# Patient Record
Sex: Female | Born: 1975 | Race: White | Hispanic: No | Marital: Married | Smoking: Never smoker
Health system: Southern US, Community
[De-identification: ages and names within clinical notes are randomized; demographics above are authoritative.]

## PROBLEM LIST (undated history)

## (undated) DIAGNOSIS — K219 Gastro-esophageal reflux disease without esophagitis: Secondary | ICD-10-CM

## (undated) DIAGNOSIS — T8859XA Other complications of anesthesia, initial encounter: Secondary | ICD-10-CM

## (undated) DIAGNOSIS — T85192A Other mechanical complication of implanted electronic neurostimulator (electrode) of spinal cord, initial encounter: Secondary | ICD-10-CM

## (undated) DIAGNOSIS — M5416 Radiculopathy, lumbar region: Secondary | ICD-10-CM

## (undated) DIAGNOSIS — Z9889 Other specified postprocedural states: Secondary | ICD-10-CM

## (undated) DIAGNOSIS — M5126 Other intervertebral disc displacement, lumbar region: Secondary | ICD-10-CM

## (undated) DIAGNOSIS — R112 Nausea with vomiting, unspecified: Secondary | ICD-10-CM

## (undated) DIAGNOSIS — G43909 Migraine, unspecified, not intractable, without status migrainosus: Secondary | ICD-10-CM

## (undated) HISTORY — PX: OTHER SURGICAL HISTORY: SHX169

## (undated) HISTORY — PX: ABDOMINAL HYSTERECTOMY: SHX81

## (undated) HISTORY — PX: IMPLANTATION / PLACEMENT EPIDURAL NEUROSTIMULATOR ELECTRODES: SUR687

---

## 2004-11-29 HISTORY — PX: CHOLECYSTECTOMY: SHX55

## 2005-07-08 ENCOUNTER — Emergency Department: Payer: Self-pay | Admitting: Unknown Physician Specialty

## 2005-08-27 ENCOUNTER — Ambulatory Visit: Payer: Self-pay | Admitting: Internal Medicine

## 2005-11-11 ENCOUNTER — Ambulatory Visit: Payer: Self-pay | Admitting: Obstetrics & Gynecology

## 2007-02-07 ENCOUNTER — Ambulatory Visit: Payer: Self-pay | Admitting: Internal Medicine

## 2007-03-28 ENCOUNTER — Ambulatory Visit: Payer: Self-pay | Admitting: Obstetrics & Gynecology

## 2007-04-06 ENCOUNTER — Ambulatory Visit: Payer: Self-pay | Admitting: Obstetrics & Gynecology

## 2007-09-17 ENCOUNTER — Emergency Department: Payer: Self-pay | Admitting: Emergency Medicine

## 2007-10-21 ENCOUNTER — Emergency Department: Payer: Self-pay | Admitting: Emergency Medicine

## 2007-10-21 ENCOUNTER — Other Ambulatory Visit: Payer: Self-pay

## 2007-10-27 ENCOUNTER — Ambulatory Visit: Payer: Self-pay | Admitting: Internal Medicine

## 2007-12-29 ENCOUNTER — Ambulatory Visit: Payer: Self-pay | Admitting: General Surgery

## 2008-01-09 IMAGING — NM NUCLEAR MEDICINE HEPATOHBILIARY INCLUDE GB
3 series · 21 of 21 positions shown · non-contrast
Comparison: none

REASON FOR EXAM: Abdominal Bloating Neg US October 21, 2007
COMMENTS:

[Series 1000: gallbladder statics · 4.80mm/px · 9 of 9 slices shown]
[im 1/9]
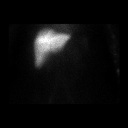
[im 2/9]
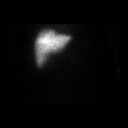
[im 3/9]
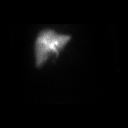
[im 4/9]
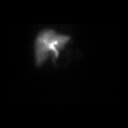
[im 5/9]
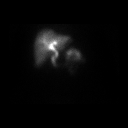
[im 6/9]
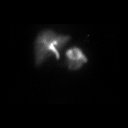
[im 7/9]
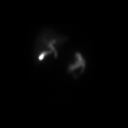
[im 8/9]
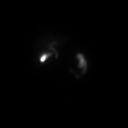
[im 9/9]
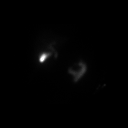

[Series 1000: gallbladder dynamic (results) · 4.80mm/px · 6 of 60 frames shown]
[frame 6/60]
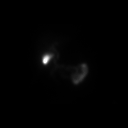
[frame 16/60]
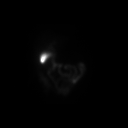
[frame 26/60]
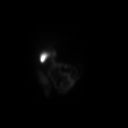
[frame 36/60]
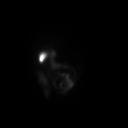
[frame 46/60]
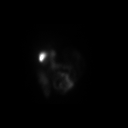
[frame 56/60]
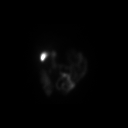

[Series 1000: gallbladder dynamic · 4.80mm/px · 6 of 60 frames shown]
[frame 6/60]
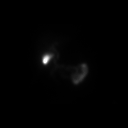
[frame 16/60]
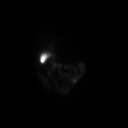
[frame 26/60]
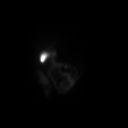
[frame 36/60]
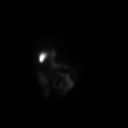
[frame 46/60]
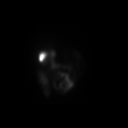
[frame 56/60]
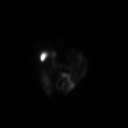

[21 of 21 positions shown; findings below may reference images not displayed]

PROCEDURE:     NM  - NM HEPATO WITH GB EJECT FRACTION  - October 27, 2007 [DATE]

RESULT:     Following injection of 8.06 mCi of technetium-55m Choletec,
there is noted prompt visualization of tracer activity in the liver at 3
minutes. At 40 minutes tracer activity is visualized in the gallbladder,
common duct and proximal small bowel.

The gallbladder ejection fraction at 30 minutes measures 39%, which is in
the lower range of normal.
IMPRESSION: 1. Normal hepatobiliary scan.
2. The gallbladder ejection fraction measures 39%.

## 2008-02-22 ENCOUNTER — Ambulatory Visit: Payer: Self-pay | Admitting: Internal Medicine

## 2008-05-06 IMAGING — US ULTRASOUND LEFT BREAST
1 series · 12 of 12 positions shown · non-contrast
Comparison: none

REASON FOR EXAM: Left breast mass 1 to 2 cm posterior to nipple
COMMENTS:

[Series 1: ultrasound left breast · 12 of 12 slices shown]
[im 1/12]
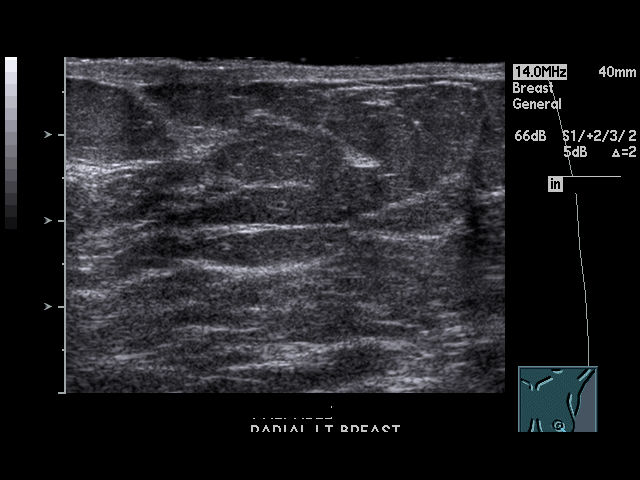
[im 2/12]
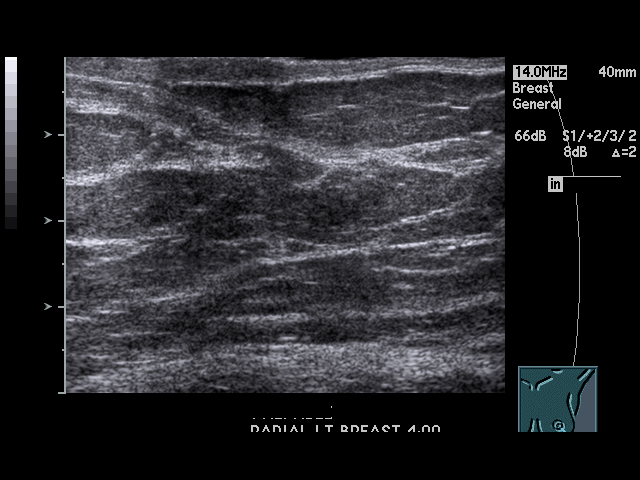
[im 3/12]
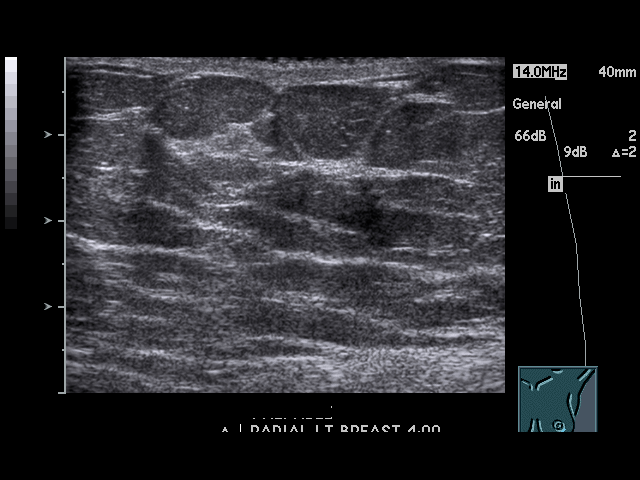
[im 4/12]
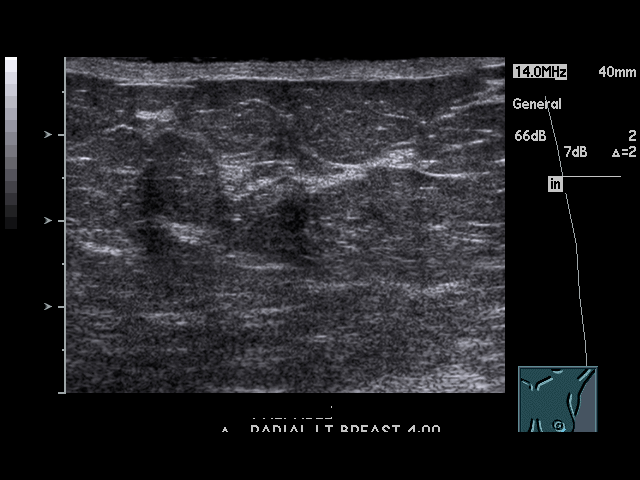
[im 5/12]
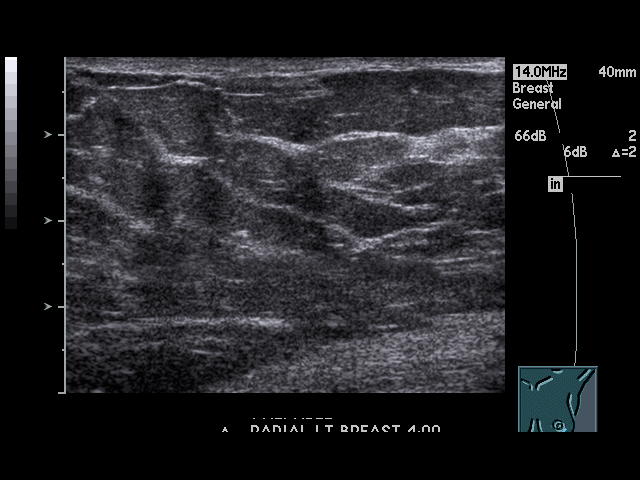
[im 6/12]
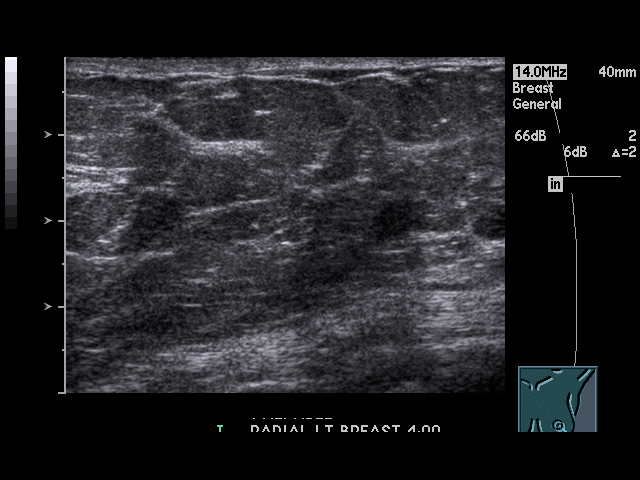
[im 7/12]
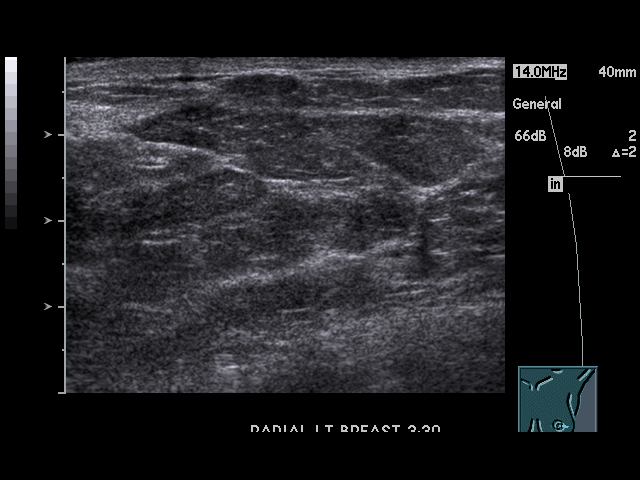
[im 8/12]
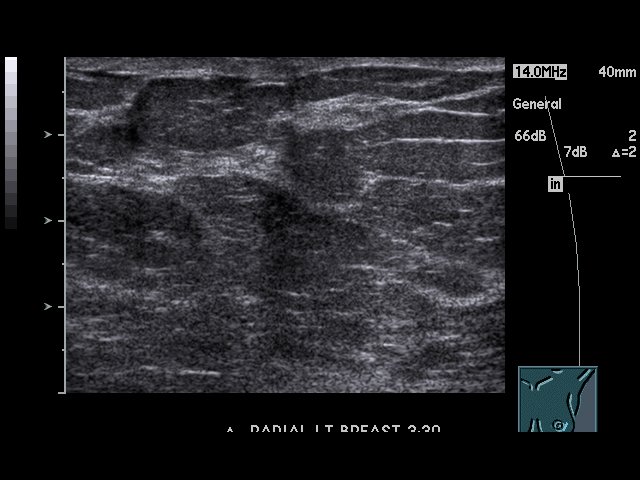
[im 9/12]
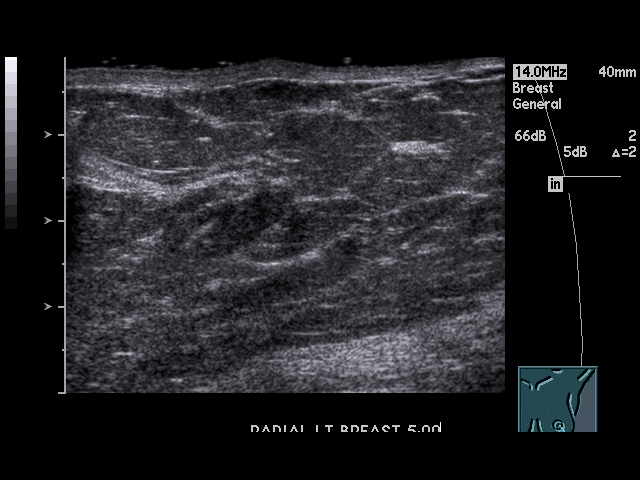
[im 10/12]
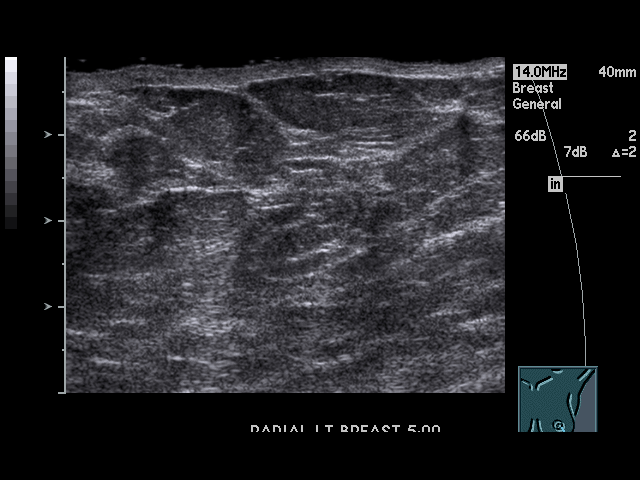
[im 11/12]
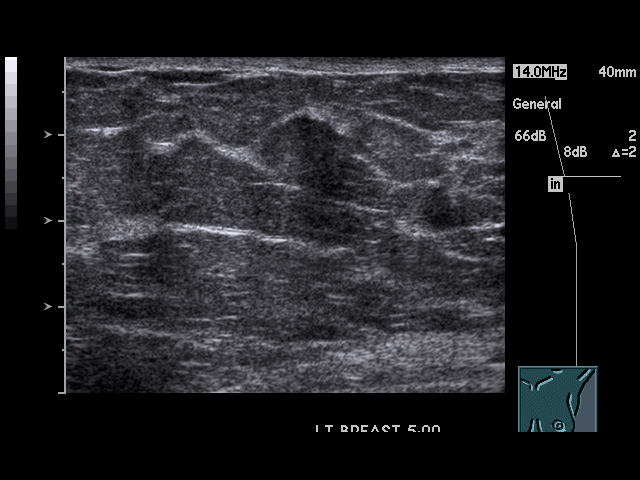
[im 12/12]
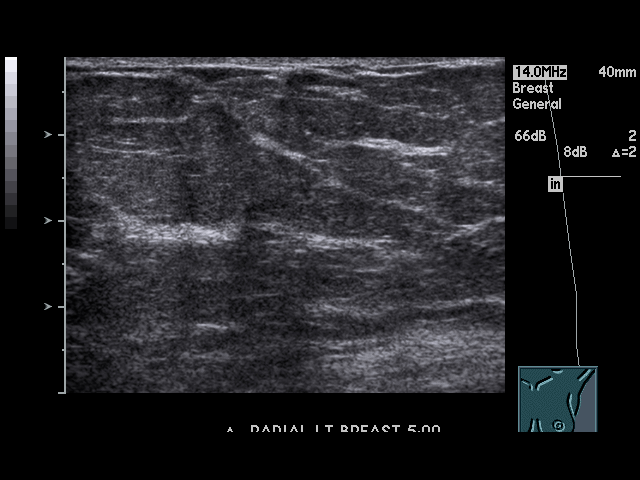

[12 of 12 positions shown; findings below may reference images not displayed]

PROCEDURE:     US  - US BREAST LEFT  - February 22, 2008  [DATE]

RESULT:     The patient has a palpable area of concern in the lower lateral
aspect of the LEFT breast at approximately 4 o'clock.

Ultrasound targeted to this region shows no sonographic abnormalities. No
mass or malignant appearing calcifications are seen. There is no skin
thickening. There is no distortion of the breast architecture.
IMPRESSION: 1.     No significant sonographic abnormalities are identified.
2.     BI-RADS: Category 1  Negative.

## 2008-08-30 ENCOUNTER — Ambulatory Visit: Payer: Self-pay | Admitting: Unknown Physician Specialty

## 2008-11-25 ENCOUNTER — Ambulatory Visit: Payer: Self-pay | Admitting: Internal Medicine

## 2010-06-02 DIAGNOSIS — G43909 Migraine, unspecified, not intractable, without status migrainosus: Secondary | ICD-10-CM | POA: Insufficient documentation

## 2010-06-02 DIAGNOSIS — D519 Vitamin B12 deficiency anemia, unspecified: Secondary | ICD-10-CM | POA: Insufficient documentation

## 2010-06-02 DIAGNOSIS — G4733 Obstructive sleep apnea (adult) (pediatric): Secondary | ICD-10-CM | POA: Insufficient documentation

## 2010-06-02 DIAGNOSIS — J309 Allergic rhinitis, unspecified: Secondary | ICD-10-CM | POA: Insufficient documentation

## 2010-06-02 DIAGNOSIS — F411 Generalized anxiety disorder: Secondary | ICD-10-CM | POA: Insufficient documentation

## 2012-11-29 HISTORY — PX: LAPAROSCOPIC GASTRIC SLEEVE RESECTION: SHX5895

## 2013-10-03 DIAGNOSIS — Z9884 Bariatric surgery status: Secondary | ICD-10-CM | POA: Insufficient documentation

## 2014-10-21 DIAGNOSIS — F32A Depression, unspecified: Secondary | ICD-10-CM | POA: Insufficient documentation

## 2016-04-14 DIAGNOSIS — G4700 Insomnia, unspecified: Secondary | ICD-10-CM | POA: Insufficient documentation

## 2017-01-04 DIAGNOSIS — R5382 Chronic fatigue, unspecified: Secondary | ICD-10-CM | POA: Insufficient documentation

## 2017-11-30 DIAGNOSIS — C9110 Chronic lymphocytic leukemia of B-cell type not having achieved remission: Secondary | ICD-10-CM

## 2017-11-30 DIAGNOSIS — F39 Unspecified mood [affective] disorder: Secondary | ICD-10-CM

## 2017-11-30 DIAGNOSIS — M1990 Unspecified osteoarthritis, unspecified site: Secondary | ICD-10-CM

## 2017-11-30 DIAGNOSIS — G309 Alzheimer's disease, unspecified: Secondary | ICD-10-CM

## 2017-11-30 DIAGNOSIS — E039 Hypothyroidism, unspecified: Secondary | ICD-10-CM

## 2017-12-24 DIAGNOSIS — M722 Plantar fascial fibromatosis: Secondary | ICD-10-CM | POA: Insufficient documentation

## 2018-08-26 DIAGNOSIS — G8929 Other chronic pain: Secondary | ICD-10-CM | POA: Insufficient documentation

## 2018-08-26 DIAGNOSIS — N879 Dysplasia of cervix uteri, unspecified: Secondary | ICD-10-CM | POA: Insufficient documentation

## 2018-08-26 DIAGNOSIS — K219 Gastro-esophageal reflux disease without esophagitis: Secondary | ICD-10-CM | POA: Insufficient documentation

## 2018-11-04 DIAGNOSIS — S93492A Sprain of other ligament of left ankle, initial encounter: Secondary | ICD-10-CM | POA: Insufficient documentation

## 2019-07-01 DIAGNOSIS — M5416 Radiculopathy, lumbar region: Secondary | ICD-10-CM | POA: Insufficient documentation

## 2019-07-13 DIAGNOSIS — M5126 Other intervertebral disc displacement, lumbar region: Secondary | ICD-10-CM | POA: Insufficient documentation

## 2020-04-10 DIAGNOSIS — M7541 Impingement syndrome of right shoulder: Secondary | ICD-10-CM | POA: Insufficient documentation

## 2021-11-11 ENCOUNTER — Ambulatory Visit: Payer: Self-pay | Admitting: Dietician

## 2021-12-07 ENCOUNTER — Encounter: Payer: Self-pay | Admitting: Dietician

## 2021-12-07 NOTE — Progress Notes (Signed)
Have not heard back from patient to reschedule her cancelled appointment from 11/11/21. Sent notification to referring provider.

## 2022-03-17 ENCOUNTER — Ambulatory Visit: Payer: BC Managed Care – PPO | Admitting: Podiatry

## 2022-03-17 DIAGNOSIS — M722 Plantar fascial fibromatosis: Secondary | ICD-10-CM

## 2022-03-30 ENCOUNTER — Ambulatory Visit: Payer: BC Managed Care – PPO | Admitting: Podiatry

## 2022-03-30 ENCOUNTER — Ambulatory Visit: Payer: BC Managed Care – PPO

## 2022-03-30 DIAGNOSIS — M722 Plantar fascial fibromatosis: Secondary | ICD-10-CM | POA: Diagnosis not present

## 2022-03-30 MED ORDER — BETAMETHASONE SOD PHOS & ACET 6 (3-3) MG/ML IJ SUSP
3.0000 mg | Freq: Once | INTRAMUSCULAR | Status: AC
  Administered 2022-03-30: 3 mg via INTRA_ARTICULAR

## 2022-03-30 MED ORDER — METHYLPREDNISOLONE 4 MG PO TBPK: ORAL_TABLET | ORAL | 0 refills | Status: DC

## 2022-03-30 NOTE — Progress Notes (Signed)
? ?  Subjective: ?46 y.o. female presenting as a new patient for evaluation of right heel and leg pain.  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  She is experiencing numbness tingling and burning extending down her right lower extremity.  Patient also states that over the last few weeks she has had increased focal pain to her right plantar heel.  She would like to have it evaluated today.  She presents for further treatment and evaluation ? ? ?No past medical history on file. ? ?Allergies  ?Allergen Reactions  ? Nitrofurantoin Other (See Comments)  ?  Other reaction(s): Unknown ?Other Reaction: Not Assessed ?  ? Oxycodone Itching  ? ? ?Objective: ?Physical Exam ?General: The patient is alert and oriented x3 in no acute distress. ? ?Dermatology: Skin is warm, dry and supple bilateral lower extremities. Negative for open lesions or macerations bilateral.  ? ?Vascular: Dorsalis Pedis and Posterior Tibial pulses palpable bilateral.  Capillary fill time is immediate to all digits. ? ?Neurological: Epicritic and protective threshold intact bilateral.  ? ?Musculoskeletal: Tenderness to palpation to the plantar aspect of the right heel along the plantar fascia. All other joints range of motion within normal limits bilateral. Strength 5/5 in all groups bilateral.  ? ?Radiographic exam: ?Normal osseous mineralization. Joint spaces preserved. No fracture/dislocation/boney destruction. No other soft tissue abnormalities or radiopaque foreign bodies.  ? ?Assessment: ?1. Plantar fasciitis right ?2. H/o lumbar radiculopathy secondary to injury ? ?Plan of Care:  ?1. Patient evaluated. Xrays reviewed.   ?2. Injection of 0.5cc Celestone soluspan injected into the right plantar fascia  ?3. Rx for Medrol Dose Pack placed ?4.  No NSAIDs prescribed.  Patient has history of gastric sleeve ?5. Plantar fascial band(s) dispensed ?6. Instructed patient regarding therapies and  modalities at home to alleviate symptoms.  ?7.  Appointment with Pedorthist for custom molded orthotics which may not only help the plantar fasciitis but potentially alleviate some of her hip and back pain  ?8.  Continue Lyrica and Cymbalta as per prescribing physician for her back pain ?9.  Return to clinic in 4 weeks.   ? 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 ? ?Edrick Kins, DPM ?[facility] ? ?Dr. Edrick Kins, DPM  ?  ?[address]                                        ?[place], [place] [address]                ?Office [phone]  ?Fax [phone] ? ? ? ? ?

## 2022-04-05 ENCOUNTER — Other Ambulatory Visit: Payer: BC Managed Care – PPO

## 2022-04-16 ENCOUNTER — Other Ambulatory Visit: Payer: BC Managed Care – PPO

## 2022-04-30 ENCOUNTER — Ambulatory Visit: Payer: BC Managed Care – PPO | Admitting: Podiatry

## 2022-05-04 ENCOUNTER — Encounter: Payer: Self-pay | Admitting: Podiatry

## 2022-05-04 ENCOUNTER — Ambulatory Visit: Payer: BC Managed Care – PPO | Admitting: Podiatry

## 2022-05-04 DIAGNOSIS — M722 Plantar fascial fibromatosis: Secondary | ICD-10-CM | POA: Diagnosis not present

## 2022-05-04 MED ORDER — BETAMETHASONE SOD PHOS & ACET 6 (3-3) MG/ML IJ SUSP: 3.0000 mg | Freq: Once | INTRAMUSCULAR | Status: DC

## 2022-05-04 NOTE — Progress Notes (Signed)
   Subjective: 46 y.o. female presenting for follow-up evaluation of right heel and leg pain.  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  She continues to experience numbness tingling and burning extending down her right lower extremity, however her right heel feels significantly better.  She says the injection and the prednisone pack helped significantly  Patient does have a new complaint today regarding left heel pain.  She states that her left heel is somewhat achy and she is considering an injection in her left heel today.  This is been ongoing for few weeks now.  Last visit the main concern was the right heel however currently today it is asymptomatic   No past medical history on file.  Allergies  Allergen Reactions   Nitrofurantoin Other (See Comments)    Other reaction(s): Unknown Other Reaction: Not Assessed    Oxycodone Itching    Objective: Physical Exam General: The patient is alert and oriented x3 in no acute distress.  Dermatology: Skin is warm, dry and supple bilateral lower extremities. Negative for open lesions or macerations bilateral.   Vascular: Dorsalis Pedis and Posterior Tibial pulses palpable bilateral.  Capillary fill time is immediate to all digits.  Neurological: Epicritic and protective threshold intact bilateral.   Musculoskeletal: Tenderness to palpation to the plantar aspect of the left heel along the plantar fascia. All other joints range of motion within normal limits bilateral. Strength 5/5 in all groups bilateral.   Assessment: 1. Plantar fasciitis right 2. 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  Plantar fasciitis left  Plan of Care:  1. Patient evaluated. 2. Injection of 0.5cc Celestone soluspan injected into the left plantar fascia  3.  No NSAIDs prescribed.  Patient has history of gastric sleeve 4.  Continue plantar fascial brace as needed 5.  custom molded orthotics pending which may not only help the  plantar fasciitis but potentially alleviate some of her hip and back pain  6.  Continue Lyrica and Cymbalta as per prescribing physician for her back pain 7.  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 Edrick Kins, DPM    [address], [place] [address]                Office [phone]  Fax [phone]    1

## 2022-08-10 ENCOUNTER — Ambulatory Visit: Payer: BC Managed Care – PPO | Admitting: Podiatry

## 2022-08-10 DIAGNOSIS — M7661 Achilles tendinitis, right leg: Secondary | ICD-10-CM | POA: Diagnosis not present

## 2022-08-10 MED ORDER — METHYLPREDNISOLONE 4 MG PO TBPK: ORAL_TABLET | ORAL | 0 refills | Status: DC

## 2022-08-10 NOTE — Progress Notes (Signed)
   Chief Complaint  Patient presents with   Foot Pain    Patient is here for right heel pain would like a injection for pain.    HPI: 46 y.o. female presenting today for new complaint of pain and tenderness to the posterior aspect of the right heel.  Patient does have a history of plantar fasciitis.  She is requesting another injection today.  She states that this pain in the back of the heel began about 2-3 weeks ago.  She has not done anything currently for treatment.  No past medical history on file.  Allergies  Allergen Reactions   Nitrofurantoin Other (See Comments)    Other reaction(s): Unknown Other Reaction: Not Assessed    Oxycodone Itching     Physical Exam: General: The patient is alert and oriented x3 in no acute distress.  Dermatology: Skin is warm, dry and supple bilateral lower extremities. Negative for open lesions or macerations.  Vascular: Palpable pedal pulses bilaterally. Capillary refill within normal limits.  Negative for any significant edema or erythema  Neurological: Light touch and protective threshold grossly intact  Musculoskeletal Exam: No pedal deformities noted.  There is tenderness to palpation along the posterior tubercle of the calcaneus at the insertion of the Achilles tendon.  Muscle strength 5/5 with strong plantarflexion against resistance  Assessment: 1.  Insertional Achilles tendinitis right   Plan of Care:  1. Patient evaluated.  2.  Prescription for a Medrol Dosepak 3.  No NSAIDs.  Patient has history of gastric sleeve 4.  Recommend good supportive shoes and sneakers 5.  Recommend daily stretching exercises which were demonstrated today 6.  Return to clinic as needed  *Going to the [employer] baseball game 08/26/2022     Edrick Kins, DPM [facility]  Dr. Edrick Kins, DPM    [address], [place] [address]                Office [phone]  Fax [phone]

## 2023-02-12 DIAGNOSIS — E663 Overweight: Secondary | ICD-10-CM | POA: Insufficient documentation

## 2023-12-14 NOTE — Procedures (Signed)
 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   STAFF: Circulator: Ema Ards, RN Physician Assistant: Saa Landry Caldron, PA Radiology Technologist: Lovenia Share, RT Scrub Person: Xavier Neptune, RN; Kee Millman Surgical Attendant: Center, Sam  ANESTHESIA: General   OPERATIVE REPORT:     OPERATION: Procedure(s): REVISION INCLUDING REPLACEMENT, OF SPINAL NEUROSTIMULATOR ELECTRODE PERCUTANEOUS ARRAY(S) (Left) INSERTION OF SPINAL NEUROSTIMULATOR PULSE GENERATOR (Left)  INDICATIONS FOR PROCEDURE: Virginia Tucker  is a pleasant 48 y.o. female who presented to neurosurgical attention secondary to medically refractory chronic neuropathic pain.   Patient returns via telehealth to discuss revision of her spinal cord stimulator.  We previously found that one of her leads has migrated ott of the epidural space and we discussed a possible  revision.   Page returns today to discuss this option and to discuss her difficulty with charging her device.     She states that the SCS is working well to control her pain and she would like to continue to use it to control her chronic pain, so she would like to proceed with the device revision. Would like to proceed with surgery.  Unfortunately, she also states that she is having significant difficulty charging her IPG.  States that as she is charging the system it will just stop charging and she will have to reset and get it repositioned and start over again.  States that she is charging 2.5-3 hours every other day and that this is incredibly bothersome to her.    We discussed the risks and benefits of the procedure as well as alternatives and limitations including but not limited to infection, lead migration, fracture or failure to obtain adequate pain relief, numbness, weakness and even paralysis. Patient agreed and was eager to proceed with surgery.  DESCRIPTION OF PROCEDURE: The patient was transferred to the operating room where she was given preoperative prophylactic IV antibiotics.  With the assistance of the operating room staff, the patient was positioned on the surgical bed in the prone position on a radiolucent Wilson frame. MAC anesthesia was induced by the anesthesia staff.   I personally inspected the patient to ensure that all pressure points were appropriately padded and devoid of pressure. Additionally, I confirmed with my anesthesia colleague that the eyes were devoid of pressure and that the airway was not compromised in any way.  A minimal shave was then performed and the operative sites were carefully marked, prepped and draped in the usual sterile fashion. I then personally prepped the patient with alcohol, chlorhexidine and chloraprep allowing the incision area to fully dry.  The patient was then draped in a sterile fashion and prior to incision, a time out was performed where where I confirmed the patients name, surgical procedure, and blood status and ensured that antibiotics were infused within one hour of incision.  A C-arm fluoroscope was brought  in.  The migrated lead was identified and removed . A 14-gauge Tuohy needle was introduced into the epidural space, starting at the L2 pedicle and guided into  the epidural space using loss-of-resistance technique.  An new SCS electrode, was then advanced under fluoroscopic control to the T7 endplate junction.  Evaluation was carried out on the lead.      Based on intraoperative fluoroscopy, it was felt that this represented an excellent location for both leads.  Next, the gluteal incision was opened with a 10-blade.  The prior pocket was opened and the lead inserted into the internal pulse generator.  The set screws were then tightened.   Electrode selectability, output modulation, cycling, impedance were tested and found to be in an acceptable range.  All incisions were then copiously irrigated with bacitracin solution and meticulous hemostasis obtained. All incisions were irrigated copiously with bacitracin irrigation and closed in layers.  The patient was awoken and brought to the post anesthesia recovery room awake, alert and neurologically intact, unchanged from her preoperative neurologic condition.     ESTIMATED BLOOD LOSS: minimal   * No values recorded between 12/13/2023 11:37 AM and 12/13/2023 12:18 PM *   TOTAL IV FLUIDS: See Anesthesia Record.   SPECIMENS:  * No specimens in log *   IMPLANTS:  Implant Name Type Inv. Item Serial No. Manufacturer Lot No. LRB No. Used Action  KIT, EVOKE PERCUTANEOUS CAP 12-LEAD 60CM - SNA  KIT, EVOKE PERCUTANEOUS CAP 12-LEAD 60CM NA [facility] [employer] [id number] N/A 1 Implanted  KIT, ACTIVE-ANCHOR EVOKE - SNA  KIT, ACTIVE-ANCHOR EVOKE NA [facility] [employer] [id number] Left 1 Implanted     COMPLICATIONS: None  DISPOSITION: PACU - hemodynamically stable.  ATTESTATION:  TP- Surgery (W/O Resident) - I performed the entire procedure w/o resident involvement (TP).

## 2024-01-06 NOTE — Progress Notes (Signed)
 ASSESSMENT AND PLAN  Assessment & Plan Vaginal discharge We will check for possible BV given that she did not have improvement with Diflucan. Orders: .  Wet Prep, Genital  Eye lesion Not classic location for a stye.  However we will have her do warm compresses and gentle massage to see if that helps.  She will let me know if there is no improvement.      SUBJECTIVE  Virginia Tucker is a 48 y.o. female who presents to the [facility] to discuss:  Vaginal discharge- increased and having some vaginal itching. Started about a week and a half ago. Started gradually. First only when wipes, and then started in panties.   Tried taking diflucan 3 days ago. Minimal improvement. Some irritation when she urinates.   Stye-has a new eye shadow that has glitter in it.  Yesterday when putting on make up on she thought she got some of it in her eye because she felt an irritation.  She does wear contacts and admits to sometimes sleeping in them.   Pulled lid down and saw a bump inside the eyelid.     Health Maintenance In terms of health maintenance she is due for  Health Maintenance Due  Topic Date Due  . Hepatitis C Screen  Never done  . Colon Cancer Screening  Never done  . COVID-19 Vaccine (3 - 2024-25 season) 07/31/2023  . Influenza Vaccine (1) 07/31/2023  . HPV Cotest with Pap Smear (21-65)  09/29/2023  . Pap Smear with Cotest HPV (21-65)  10/03/2023     OBJECTIVE BP 106/66 (BP Site: L Arm, BP Position: Sitting, BP Cuff Size: Medium)   Pulse 87   Temp 36.6 C (97.9 F) (Temporal)   Ht 157.5 cm (5' 2.01) Comment: pt reported  Wt 76.2 kg (168 lb)   BMI 30.72 kg/m  Wt Readings from Last 3 Encounters:  01/06/24 76.2 kg (168 lb)  11/28/23 76.4 kg (168 lb 6.4 oz)  06/01/23 71.3 kg (157 lb 3.2 oz)    Gen: NAD Eye: an erythematous lesion in the center of lower eyelid on the inside.  Nothing visible externally   Labs No visits with results within 1 Day(s) from this  visit.  Latest known visit with results is:  Office Visit on 02/11/2023  Component Date Value  . Triglycerides 02/11/2023 127   . Cholesterol 02/11/2023 213 (H)   . HDL 02/11/2023 65 (H)   . LDL Calculated 02/11/2023 876 (H)   . VLDL Cholesterol Cal 02/11/2023 25.4   . Chol/HDL Ratio 02/11/2023 3.3   . Non-HDL Cholesterol 02/11/2023 148 (H)   . FASTING 02/11/2023 No   . Hemoglobin A1C 02/11/2023 4.9   . Estimated Average Glucose 02/11/2023 94   . TSH 02/11/2023 1.695   . WBC 02/11/2023 9.1   . RBC 02/11/2023 4.97   . HGB 02/11/2023 14.1   . HCT 02/11/2023 42.5   . MCV 02/11/2023 85.7   . [facility] 02/11/2023 28.5   . MCHC 02/11/2023 33.2   . RDW 02/11/2023 14.3   . MPV 02/11/2023 8.2   . Platelet 02/11/2023 321   . Vitamin D Total (25OH) 02/11/2023 24.8   . Vitamin B-12 02/11/2023 214   . Folate 02/11/2023 8.7   . Ferritin 02/11/2023 126.3   . Sodium 02/11/2023 137   . Potassium 02/11/2023 3.9   . Chloride 02/11/2023 106   . CO2 02/11/2023 27.0   . Anion Gap 02/11/2023 4 (L)   .  BUN 02/11/2023 15   . Creatinine 02/11/2023 0.97   . BUN/Creatinine Ratio 02/11/2023 15   . eGFR CKD-EPI (2021) Fema* 02/11/2023 73   . Glucose 02/11/2023 73   . Calcium 02/11/2023 9.3   . Albumin 02/11/2023 3.8   . Total Protein 02/11/2023 6.7   . Total Bilirubin 02/11/2023 0.4   . AST 02/11/2023 17   . ALT 02/11/2023 14   . Alkaline Phosphatase 02/11/2023 54

## 2024-06-27 ENCOUNTER — Inpatient Hospital Stay
Admission: RE | Admit: 2024-06-27 | Discharge: 2024-06-27 | Disposition: A | Discharge status: Home / Self Care | Payer: Self-pay | Source: Ambulatory Visit | Attending: Neurosurgery | Admitting: Neurosurgery

## 2024-06-27 ENCOUNTER — Other Ambulatory Visit: Payer: Self-pay | Admitting: Family Medicine

## 2024-06-27 DIAGNOSIS — Z049 Encounter for examination and observation for unspecified reason: Secondary | ICD-10-CM

## 2024-07-02 ENCOUNTER — Encounter: Payer: Self-pay | Admitting: Neurosurgery

## 2024-07-02 DIAGNOSIS — E559 Vitamin D deficiency, unspecified: Secondary | ICD-10-CM | POA: Insufficient documentation

## 2024-07-02 NOTE — Progress Notes (Unsigned)
 Referring Physician:  Jannett Search, MD [address] [place],  [place] [address]  Primary Physician:  Jannett Search, MD  History of Present Illness: 07/03/2024 Virginia Tucker is here today with a chief complaint of herniated disc at L3-L4 who presents with a malfunctioning spinal cord stimulator and persistent back pain.  She initially herniated her disc and an injury several years ago.  She was evaluated by 2 different spine surgeons, was not felt to be a candidate for spinal surgery at her last evaluation in 2023.  She then had evaluation for spinal cord stimulator.  She had an excellent response to stimulation.  She underwent spinal cord stimulator implantation with the [place] Evoke system in January 2024. The leads did not remain in place, and the anchors dislodged, causing the program to malfunction. A revision surgery in January 2025 did not resolve the issue, with continued lead migration and anchor protrusion. The program fails when the anchors press on certain areas, and the device's effectiveness diminishes with movement.  She experiences persistent back pain, unchanged over the past two years. The spinal cord stimulator provided pain relief during the trial and immediately after implantation, but the relief was not sustained due to the device malfunction. She feels the device move and change intensity, becoming ineffective shortly after adjustments. She desires pain relief and an active lifestyle, especially as a grandmother.  Bowel/Bladder Dysfunction: none  Conservative measures:  Physical therapy:  has not participated in recently Multimodal medical therapy including regular antiinflammatories:  Cymbalta, Gabapentin, Medrol , Lyrica, Tizanidine Injections:  05/20/21-SI Right L4-5, L5-S1 and Left L4-5, L5-S1  11/04/21-Right Facet Joint Injection    Past Surgery:  11/04/22 and 01/27/23--IMPLANTATION PERCUTANEOUS EPIDURAL NEUROSTIMULATORY ELECTRODES TRIAL   01/27/23--INCISION FOR IMPLANTATION/REPLACEMENT SPINAL NEUROSTIMULATOR  12/13/23--INSERTION REPLACEMENT OF SPINAL PERCUTANEOUS ELECTRODE ARRAY    Virginia Tucker has no symptoms of cervical myelopathy.  The symptoms are causing a significant impact on the patient's life.   I have utilized the care everywhere function in epic to review the outside records available from external health systems.  Review of Systems:  A 10 point review of systems is negative, except for the pertinent positives and negatives detailed in the HPI.  Past Medical History: History reviewed. No pertinent past medical history.  Past Surgical History: Past Surgical History:  Procedure Laterality Date   IMPLANTATION / PLACEMENT EPIDURAL NEUROSTIMULATOR ELECTRODES     11/04/22 and 01/27/23    Allergies: Allergies as of 07/03/2024 - Review Complete 07/03/2024  Allergen Reaction Noted   Nitrofurantoin Other (See Comments) 03/30/2022   Oxycodone Itching 08/25/2018   Sulfa antibiotics Dermatitis and Other (See Comments) 04/03/2013    Medications:  Current Outpatient Medications:    ALPRAZolam (XANAX) 0.5 MG tablet, Take 0.5 mg by mouth 2 (two) times daily as needed., Disp: , Rfl:    buPROPion (WELLBUTRIN XL) 150 MG 24 hr tablet, Take 150 mg by mouth daily., Disp: , Rfl:    cetirizine (ZYRTEC) 10 MG tablet, Take 10 mg by mouth daily., Disp: , Rfl:    DULoxetine (CYMBALTA) 30 MG capsule, Take 30 mg by mouth 2 (two) times daily., Disp: , Rfl:    estradiol (VIVELLE-DOT) 0.025 MG/24HR, Place 1 patch onto the skin 2 (two) times a week., Disp: , Rfl:    famotidine (PEPCID) 20 MG tablet, Take 20 mg by mouth 2 (two) times daily., Disp: , Rfl:    linaclotide (LINZESS) 72 MCG capsule, Take 72 mcg by mouth daily before breakfast., Disp: ,  Rfl:    omeprazole (PRILOSEC) 40 MG capsule, Take 40 mg by mouth daily., Disp: , Rfl:    pregabalin (LYRICA) 50 MG capsule, Take 50 mg by mouth 2 (two) times daily., Disp: , Rfl:     Semaglutide-Weight Management (WEGOVY) 1.7 MG/0.75ML SOAJ, Inject 1.7 mg into the skin., Disp: , Rfl:    topiramate (TOPAMAX) 100 MG tablet, Take 100 mg by mouth daily., Disp: , Rfl:    traZODone (DESYREL) 100 MG tablet, Take 100 mg by mouth at bedtime., Disp: , Rfl:    FLUoxetine (PROZAC) 20 MG tablet, Take 20 mg by mouth daily. (Patient not taking: Reported on 07/03/2024), Disp: , Rfl:   Current Facility-Administered Medications:    betamethasone  acetate-betamethasone  sodium phosphate (CELESTONE ) injection 3 mg, 3 mg, Intra-articular, Once, Evans, Thresa HERO, DPM  Social History: Social History   Tobacco Use   Smoking status: Never   Smokeless tobacco: Never  Substance Use Topics   Alcohol use: Yes    Comment: Occasionally   Drug use: Never    Family Medical History: History reviewed. No pertinent family history.  Physical Examination: Vitals:   07/03/24 1458  BP: 128/88    General: Patient is in no apparent distress. Attention to examination is appropriate.  Neck:   Supple.  Full range of motion.  Respiratory: Patient is breathing without any difficulty.   NEUROLOGICAL:     Awake, alert, oriented to person, place, and time.  Speech is clear and fluent.   Cranial Nerves: Pupils equal round and reactive to light.  Facial tone is symmetric.  Facial sensation is symmetric. Shoulder shrug is symmetric. Tongue protrusion is midline.  There is no pronator drift.  Strength: Side Biceps Triceps Deltoid Interossei Grip Wrist Ext. Wrist Flex.  R 5 5 5 5 5 5 5   L 5 5 5 5 5 5 5    Side Iliopsoas Quads Hamstring PF DF EHL  R 5 5 5 5 5 5   L 5 5 5 5 5 5    Reflexes are 1+ and symmetric at the biceps, triceps, brachioradialis, patella and achilles.   Hoffman's is absent.   Bilateral upper and lower extremity sensation is intact to light touch.    No evidence of dysmetria noted.  Gait is normal.    Her leads are palpable under the skin.  The securing device on the left lead is  movable with direct palpation.  Medical Decision Making  Imaging: Xray imaging reviewed Jan 2025 - one of the leads is migrated inferiorly  I have personally reviewed the images and agree with the above interpretation.  Assessment and Plan: Virginia Tucker is a pleasant 48 y.o. female with chronic pain syndrome who has previously had relief with a spinal cord stimulator.  She has now had 2 different malfunctions of her spinal cord stimulator with lead migration.  She is no longer a candidate for percutaneous spinal cord stimulator placement as she has now had 2 different malfunctions due to lead migration.  She had an excellent response to her prior trial.  As such, I feel that she is an appropriate candidate for removal of her current system and placement of a new open paddle lead with a new internal pulse generator.  She also reported that the current placement of the pulse generator in her left buttock is uncomfortable.  She would like this moved.  Her MRI of the thoracic spine is over 54 years old.  I would like to obtain an MRI scan to ensure  that she is anatomically suitable for this procedure.   I discussed the planned procedure at length with the patient, including the risks, benefits, alternatives, and indications. The risks discussed include but are not limited to bleeding, infection, need for reoperation, spinal fluid leak, stroke, vision loss, anesthetic complication, coma, paralysis, and even death. I also described in detail that improvement was not guaranteed.  The patient expressed understanding of these risks, and asked that we proceed with surgery. I described the surgery in layman's terms, and gave ample opportunity for questions, which were answered to the best of my ability.  I spent a total of 30 minutes in this patient's care today. This time was spent reviewing pertinent records including imaging studies, obtaining and confirming history, performing a directed evaluation,  formulating and discussing my recommendations, and documenting the visit within the medical record.      Thank you for involving me in the care of this patient.      Ulyess Muto K. Clois MD, [facility] Neurosurgery

## 2024-07-03 ENCOUNTER — Encounter: Payer: Self-pay | Admitting: Neurosurgery

## 2024-07-03 ENCOUNTER — Ambulatory Visit (INDEPENDENT_AMBULATORY_CARE_PROVIDER_SITE_OTHER): Payer: Self-pay | Admitting: Neurosurgery

## 2024-07-03 VITALS — BP 128/88 | Ht 62.0 in | Wt 163.0 lb

## 2024-07-03 DIAGNOSIS — G894 Chronic pain syndrome: Secondary | ICD-10-CM | POA: Diagnosis not present

## 2024-07-03 DIAGNOSIS — T85192A Other mechanical complication of implanted electronic neurostimulator (electrode) of spinal cord, initial encounter: Secondary | ICD-10-CM

## 2024-07-03 DIAGNOSIS — T85192S Other mechanical complication of implanted electronic neurostimulator (electrode) of spinal cord, sequela: Secondary | ICD-10-CM

## 2024-07-03 NOTE — Addendum Note (Signed)
 Addended by: Glendoris Nodarse on: 07/03/2024 04:03 PM   Modules accepted: Orders

## 2024-07-03 NOTE — Patient Instructions (Signed)
 Please see below for information in regards to your upcoming surgery:   Planned surgery: spinal cord stimulator removal; thoracic laminectomy for spinal cord stimulator placement   Surgery date: 09/10/24 at [facility] (Medical Mall: [address] [place], [place] [address]) - you will find out your arrival time the business day before your surgery.   Pre-op appointment at [facility] Pre-admit Testing: you will receive a call with a date/time for this appointment. If you are scheduled for an in person appointment, Pre-admit Testing is located on the first floor of the [facility] building, [address], [address]. During this appointment, they will advise you which medications you can take the morning of surgery, and which medications you will need to hold for surgery. Labs (such as blood work, EKG) may be done at your pre-op appointment. You are not required to fast for these labs. Should you need to change your pre-op appointment, please call Pre-admit testing at [phone].     Diabetes/heart failure/kidney disease/weight loss medications that require an extended hold: Per anesthesia guidelines (due to the increased risk of aspiration caused by delayed gastric emptying):  Wegovy injections: hold for 7 days prior to surgery    Surgical clearance: we will send a clearance form to Dr Joesph Morris. They may wish to see you in their office prior to signing the clearance form. If so, they may call you to schedule an appointment.      Common restrictions after spine surgery: No bending, lifting, or twisting ("BLT"). Avoid lifting objects heavier than 10 pounds for the first 6 weeks after surgery. Where possible, avoid household activities that involve lifting, bending, reaching, pushing, or pulling such as laundry, vacuuming, grocery shopping, and childcare. Try to arrange for help from friends and family for these activities while you heal. Do  not drive while taking prescription pain medication. Weeks 6 through 12 after surgery: avoid lifting more than 25 pounds.     How to contact us :  If you have any questions/concerns before or after surgery, you can reach us  at [phone], or you can send a mychart message. We can be reached by phone or mychart 8am-4pm, Monday-Friday.  *Please note: Calls after 4pm are forwarded to a third party answering service. Mychart messages are not routinely monitored during evenings, weekends, and holidays. Please call our office to contact the answering service for urgent concerns during non-business hours.   If you have FMLA/disability paperwork, please drop it off or fax it to [phone]   Appointments/FMLA & disability paperwork: [employer] Registered Nurse/Surgery scheduler: Normal Recinos, RN Certified Medical Assistants: Don, CMA, Elenor, CMA, & Damien, CMA Physician Assistants: Lyle Decamp, PA-C, Edsel Goods, PA-C & Glade Boys, PA-C Surgeons: Penne Sharps, MD & Reeves Daisy, MD   [facility] PREADMIT TESTING VISIT and SURGERY INFORMATION SHEET   Now that surgery has been scheduled you can anticipate several phone calls from [facility] services. A pharmacy technician will call you to verify your current list of medications taken at home.               The Pre-Service Center will call to verify your insurance information and to give you billing estimates and information.             The Preadmit Testing Office will be calling to schedule a visit to obtain information for the anesthesia team and provide instructions on preparation for surgery.  What can you expect for the Preadmit  Testing Visit: Appointments may be scheduled in-person or by telephone.  If a telephone visit is scheduled, you may be asked to come into the office to have lab tests or other studies performed.   This visit will not be completed any greater than 14 days prior to your surgery.   If your surgery has been scheduled for a future date, please do not be alarmed if we have not contacted you to schedule an appointment more than a month prior to the surgery date.    Please be prepared to provide the following information during this appointment:            -Personal medical history                                               -Medication and allergy list            -Any history of problems with anesthesia              -Recent lab work or diagnostic studies            -Please notify us  of any needs we should be aware of to provide the best care possible           -You will be provided with instructions on how to prepare for your surgery.    On The Day of Surgery:  You must have a driver to take you home after surgery, you will be asked not to drive for 24 hours following surgery.  Taxi, Gisele and non-medical transport will not be acceptable means of transportation unless you have a responsible individual who will be traveling with you.  Visitors in the surgical area:   2 people will be able to visit you in your room once your preparation for surgery has been completed. During surgery, your visitors will be asked to wait in the Surgery Waiting Area.  It is not a requirement for them to stay, if they prefer to leave and come back.  Your visitor(s) will be given an update once the surgery has been completed.  No visitors are allowed in the initial recovery room to respect patient privacy and safety.  Once you are more awake and transfer to the secondary recovery area, or are transferred to an inpatient room, visitors will again be able to see you.  To respect and protect your privacy: We will ask on the day of surgery who your driver will be and what the contact number for that individual will be. We will ask if it is okay to share information with this individual, or if there is an alternative individual that we, or the surgeon, should contact to provide updates and  information. If family or friends come to the surgical information desk requesting information about you, who you have not listed with us , no information will be given.   It may be helpful to designate someone as the main contact who will be responsible for updating your other friends and family.    PREADMIT TESTING OFFICE: [phone] SAME DAY SURGERY: [phone] We look forward to caring for you before and throughout the process of your surgery.

## 2024-07-19 ENCOUNTER — Other Ambulatory Visit: Payer: Self-pay

## 2024-07-19 DIAGNOSIS — G894 Chronic pain syndrome: Secondary | ICD-10-CM

## 2024-07-19 DIAGNOSIS — T85192S Other mechanical complication of implanted electronic neurostimulator (electrode) of spinal cord, sequela: Secondary | ICD-10-CM

## 2024-07-19 DIAGNOSIS — Z01818 Encounter for other preprocedural examination: Secondary | ICD-10-CM

## 2024-08-03 NOTE — Addendum Note (Signed)
 Addended by: Lanetra Hartley W on: 08/03/2024 10:17 AM   Modules accepted: Orders

## 2024-08-09 NOTE — Addendum Note (Signed)
 Addended by: Damarien Nyman W on: 08/09/2024 09:44 AM   Modules accepted: Orders

## 2024-08-15 NOTE — Progress Notes (Signed)
 Patient for [facility] Thoracic Myelogram/CT Thoracic Myelogram on Thurs 08/16/24, I called and spoke with the patient on the phone and gave pre-procedure instructions. Pt was made aware to be here at 9:30a and check in at the new entrance. Pt stated understanding. Called 08/15/24

## 2024-08-16 ENCOUNTER — Ambulatory Visit
Admission: RE | Admit: 2024-08-16 | Discharge: 2024-08-16 | Disposition: A | Discharge status: Home / Self Care | Source: Ambulatory Visit | Attending: Neurosurgery | Admitting: Neurosurgery

## 2024-08-16 DIAGNOSIS — G894 Chronic pain syndrome: Secondary | ICD-10-CM | POA: Diagnosis not present

## 2024-08-16 DIAGNOSIS — Y758 Miscellaneous neurological devices associated with adverse incidents, not elsewhere classified: Secondary | ICD-10-CM | POA: Insufficient documentation

## 2024-08-16 DIAGNOSIS — Y838 Other surgical procedures as the cause of abnormal reaction of the patient, or of later complication, without mention of misadventure at the time of the procedure: Secondary | ICD-10-CM | POA: Insufficient documentation

## 2024-08-16 DIAGNOSIS — T85192A Other mechanical complication of implanted electronic neurostimulator (electrode) of spinal cord, initial encounter: Secondary | ICD-10-CM | POA: Diagnosis present

## 2024-08-16 DIAGNOSIS — T85192S Other mechanical complication of implanted electronic neurostimulator (electrode) of spinal cord, sequela: Secondary | ICD-10-CM

## 2024-08-16 DIAGNOSIS — M5126 Other intervertebral disc displacement, lumbar region: Secondary | ICD-10-CM | POA: Insufficient documentation

## 2024-08-16 HISTORY — DX: Gastro-esophageal reflux disease without esophagitis: K21.9

## 2024-08-16 HISTORY — DX: Other intervertebral disc displacement, lumbar region: M51.26

## 2024-08-16 MED ORDER — LIDOCAINE 1 % OPTIME INJ - NO CHARGE
10.0000 mL | Freq: Once | INTRAMUSCULAR | Status: AC
  Administered 2024-08-16: 10 mL via INTRADERMAL
  Filled 2024-08-16: qty 10

## 2024-08-16 MED ORDER — ACETAMINOPHEN 325 MG PO TABS
ORAL_TABLET | ORAL | Status: AC
  Filled 2024-08-16: qty 2

## 2024-08-16 MED ORDER — IOHEXOL 300 MG/ML  SOLN
10.0000 mL | Freq: Once | INTRAMUSCULAR | Status: AC | PRN
  Administered 2024-08-16: 10 mL

## 2024-08-16 MED ORDER — ACETAMINOPHEN 325 MG PO TABS
650.0000 mg | ORAL_TABLET | Freq: Four times a day (QID) | ORAL | Status: DC | PRN
  Administered 2024-08-16: 650 mg via ORAL

## 2024-08-16 NOTE — Discharge Instructions (Signed)
 Myelogram, Care After Refer to this sheet in the next few weeks. These instructions provide you with information on caring for yourself after your procedure. Your health care provider may also give you more specific instructions. Your treatment has been planned according to current medical practices, but problems sometimes occur. Call your health care provider if you have any problems or questions after your procedure. What can I expect after the procedure? After your procedure, it is typical to have the following sensations: Mild discomfort or pain at the insertion site. Mild headache that is relieved with pain medicines.  Follow these instructions at home:  Avoid lifting anything heavier than 10 lb (4.5 kg) for at least 12 hours after the procedure. Drink enough fluids to keep your urine clear or pale yellow. Remain at 30 degree head elevation for the remainder of the day.  Contact a health care provider if: You have fever or chills. You have nausea or vomiting. You have a headache that lasts for more than 2 days. Get help right away if: You have any numbness or tingling in your legs. You are unable to control your bowel or bladder. You have bleeding or swelling in your back at the insertion site. You are dizzy or faint. This information is not intended to replace advice given to you by your health care provider. Make sure you discuss any questions you have with your health care provider. Document Released: 11/20/2013 Document Revised: 04/22/2016 Document Reviewed: 07/24/2013 Elsevier Interactive Patient Education  2017 [employer].

## 2024-08-16 NOTE — Procedures (Signed)
 Technically successful thoracic myelogram performed at L2-L3 level with free flow of contrast to the level of the thoracic spine after approximately 15 minutes in the trendelenburg position.  No labs requested.   No immediate post procedural complication.  Post procedure: - Bed rest with bathroom privileges x 2 hours, then continue bedrest at home for remainder of today - Head of bed flat x 2 hours, may sit up x 10 minutes to eat/drink - Tylenol  500 mg PO Q6H PRN for headache - Advance diet as tolerated - Encourage PO fluids, particularly with caffeine if able to tolerate - May discharge 2 hours post procedure if stable  Please see imaging section of Epic for full dictation.  Clotilda Hesselbach, PA-C

## 2024-08-17 ENCOUNTER — Telehealth: Payer: Self-pay | Admitting: Neurosurgery

## 2024-08-17 ENCOUNTER — Emergency Department

## 2024-08-17 ENCOUNTER — Encounter: Payer: Self-pay | Admitting: Emergency Medicine

## 2024-08-17 ENCOUNTER — Other Ambulatory Visit: Payer: Self-pay

## 2024-08-17 ENCOUNTER — Emergency Department: Admission: EM | Admit: 2024-08-17 | Discharge: 2024-08-17 | Disposition: A | Discharge status: Home / Self Care

## 2024-08-17 DIAGNOSIS — M5442 Lumbago with sciatica, left side: Secondary | ICD-10-CM | POA: Diagnosis not present

## 2024-08-17 DIAGNOSIS — M5441 Lumbago with sciatica, right side: Secondary | ICD-10-CM | POA: Diagnosis not present

## 2024-08-17 DIAGNOSIS — G8918 Other acute postprocedural pain: Secondary | ICD-10-CM | POA: Diagnosis not present

## 2024-08-17 DIAGNOSIS — M545 Low back pain, unspecified: Secondary | ICD-10-CM | POA: Diagnosis present

## 2024-08-17 LAB — CBC WITH DIFFERENTIAL/PLATELET
Abs Immature Granulocytes: 0.01 K/uL (ref 0.00–0.07)
Basophils Absolute: 0 K/uL (ref 0.0–0.1)
Basophils Relative: 0 %
Eosinophils Absolute: 0.1 K/uL (ref 0.0–0.5)
Eosinophils Relative: 1 %
HCT: 41.1 % (ref 36.0–46.0)
Hemoglobin: 13.6 g/dL (ref 12.0–15.0)
Immature Granulocytes: 0 %
Lymphocytes Relative: 30 %
Lymphs Abs: 1.9 K/uL (ref 0.7–4.0)
MCH: 28.2 pg (ref 26.0–34.0)
MCHC: 33.1 g/dL (ref 30.0–36.0)
MCV: 85.1 fL (ref 80.0–100.0)
Monocytes Absolute: 0.4 K/uL (ref 0.1–1.0)
Monocytes Relative: 7 %
Neutro Abs: 3.8 K/uL (ref 1.7–7.7)
Neutrophils Relative %: 62 %
Platelets: 241 K/uL (ref 150–400)
RBC: 4.83 MIL/uL (ref 3.87–5.11)
RDW: 13.8 % (ref 11.5–15.5)
WBC: 6.1 K/uL (ref 4.0–10.5)
nRBC: 0 % (ref 0.0–0.2)

## 2024-08-17 LAB — COMPREHENSIVE METABOLIC PANEL WITH GFR
ALT: 12 U/L (ref 0–44)
AST: 14 U/L — ABNORMAL LOW (ref 15–41)
Albumin: 3.5 g/dL (ref 3.5–5.0)
Alkaline Phosphatase: 50 U/L (ref 38–126)
Anion gap: 14 (ref 5–15)
BUN: 14 mg/dL (ref 6–20)
CO2: 20 mmol/L — ABNORMAL LOW (ref 22–32)
Calcium: 9.1 mg/dL (ref 8.9–10.3)
Chloride: 109 mmol/L (ref 98–111)
Creatinine, Ser: 0.86 mg/dL (ref 0.44–1.00)
GFR, Estimated: >60 mL/min (ref >60)
Glucose, Bld: 86 mg/dL (ref 70–99)
Potassium: 3.7 mmol/L (ref 3.5–5.1)
Sodium: 143 mmol/L (ref 135–145)
Total Bilirubin: 0.8 mg/dL (ref 0.0–1.2)
Total Protein: 6.5 g/dL (ref 6.5–8.1)

## 2024-08-17 LAB — PROTIME-INR
INR: 1.1 (ref 0.8–1.2)
Prothrombin Time: 14.3 s (ref 11.4–15.2)

## 2024-08-17 LAB — URINALYSIS, COMPLETE (UACMP) WITH MICROSCOPIC
Bilirubin Urine: NEGATIVE
Glucose, UA: NEGATIVE mg/dL
Hgb urine dipstick: NEGATIVE
Ketones, ur: NEGATIVE mg/dL
Leukocytes,Ua: NEGATIVE
Nitrite: NEGATIVE
Protein, ur: NEGATIVE mg/dL
Specific Gravity, Urine: 1.003 — ABNORMAL LOW (ref 1.005–1.030)
pH: 7 (ref 5.0–8.0)

## 2024-08-17 LAB — LIPASE, BLOOD: Lipase: 42 U/L (ref 11–51)

## 2024-08-17 MED ORDER — PROCHLORPERAZINE EDISYLATE 10 MG/2ML IJ SOLN
10.0000 mg | Freq: Once | INTRAMUSCULAR | Status: AC
  Administered 2024-08-17: 10 mg via INTRAVENOUS
  Filled 2024-08-17: qty 2

## 2024-08-17 MED ORDER — ACETAMINOPHEN 500 MG PO TABS
1000.0000 mg | ORAL_TABLET | Freq: Once | ORAL | Status: AC
  Administered 2024-08-17: 1000 mg via ORAL
  Filled 2024-08-17: qty 2

## 2024-08-17 MED ORDER — MORPHINE SULFATE (PF) 4 MG/ML IV SOLN
4.0000 mg | Freq: Once | INTRAVENOUS | Status: AC
  Administered 2024-08-17: 4 mg via INTRAVENOUS
  Filled 2024-08-17: qty 1

## 2024-08-17 MED ORDER — ONDANSETRON HCL 4 MG/2ML IJ SOLN
4.0000 mg | Freq: Once | INTRAMUSCULAR | Status: AC
  Administered 2024-08-17: 4 mg via INTRAVENOUS
  Filled 2024-08-17: qty 2

## 2024-08-17 MED ORDER — HYDROCODONE-ACETAMINOPHEN 5-325 MG PO TABS: 1.0000 | ORAL_TABLET | Freq: Four times a day (QID) | ORAL | 0 refills | Status: DC | PRN

## 2024-08-17 MED ORDER — ONDANSETRON 4 MG PO TBDP: 4.0000 mg | ORAL_TABLET | Freq: Three times a day (TID) | ORAL | 0 refills | Status: AC | PRN

## 2024-08-17 NOTE — Telephone Encounter (Signed)
 I spoke to patient and she states she has been taking Tylenol  1000mg  every 4 hours. She does have some Hydrocodone  but she has not taken it. She doesn't like how it makes her feel. She has been using a heating pad. She states the NP tried 2 times to get spinal fluid and just before she was removing the needle the second time. Patient felt a sharp, shooting, burning pain all the way down the right leg and that is when the NP said I have fluid coming out now. When she started to put the dye in she had terrible pain. Since then the pain has not gone away. The only way she can get any type of relief is to lay flat completely still for about 15 minutes and the pain will ease off. She states it has eased off a little today.

## 2024-08-17 NOTE — ED Provider Notes (Signed)
 [name]    Event Date/Time   First MD Initiated Contact with Patient 08/17/24 1423     (approximate)   History   Back Pain  Presents via EMS from home  States she is having lower back pai   which moves into both upper legs. Also has a h/a   States she had a spinal stimulator removed    HPI Virginia Tucker is a 48 y.o. 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 -Underwent a myelogram yesterday by the neurosurgery team - today, patient states she has been having lower back pain since her myelogram yesterday.  Noted some pain with insertion, on her right leg.  Not had worsened pain with pushing of contrast that shot down bilateral legs.  Has had persistent pain shooting down the bilateral thigh since then. -No fever -No saddle anesthesia, no difficulty with urinating -Took Tylenol  this morning with no relief - Not on thinners -Denies any weakness in her lower extremities.  Notes pain only. -Did have a moderate headache earlier though this has resolved     Physical Exam   Triage Vital Signs: ED Triage Vitals  Encounter Vitals Group     BP 08/17/24 1404 120/79     Girls Systolic BP Percentile --      Girls Diastolic BP Percentile --      Boys Systolic BP Percentile --      Boys Diastolic BP Percentile --      Pulse Rate 08/17/24 1404 84     Resp --      Temp 08/17/24 1404 98 F (36.7 C)     Temp Source 08/17/24 1404 Oral     SpO2 08/17/24 1404 97 %     Weight 08/17/24 1405 160 lb (72.6 kg)     Height 08/17/24 1405 5' 2 (1.575 m)     Head  Circumference --      Peak Flow --      Pain Score 08/17/24 1404 4     Pain Loc --      Pain Education --      Exclude from Growth Chart --     Most recent vital signs: Vitals:   08/17/24 1404 08/17/24 1822  BP: 120/79   Pulse: 84   Temp: 98 F (36.7 C) 97.9 F (36.6 C)  SpO2: 97%      General: Awake, no distress.  CV:  Good peripheral perfusion. RRR, RP 2+ Resp:  Normal effort. CTAB Abd:  No distention. Nontender to deep palpation throughout Back:  Insertion site for myelogram unremarkable --no hematoma, no wound drainage, no surrounding erythema.  Does have tenderness to palpation in lumbar spine.  No flank tenderness bilaterally. Neuro:  Normal strength and sensation in bilateral lower extremities   ED Results / Procedures / Treatments   Labs (all labs ordered are listed, but only abnormal results are displayed) Labs Reviewed  URINALYSIS, COMPLETE (UACMP) WITH MICROSCOPIC - Abnormal; Notable for the following components:      Result Value   Color, Urine STRAW (*)    APPearance HAZY (*)    Specific Gravity, Urine 1.003 (*)    Bacteria,  UA MANY (*)    All other components within normal limits  COMPREHENSIVE METABOLIC PANEL WITH GFR - Abnormal; Notable for the following components:   CO2 20 (*)    AST 14 (*)    All other components within normal limits  LIPASE, BLOOD  CBC WITH DIFFERENTIAL/PLATELET  PROTIME-INR     EKG  N/a   RADIOLOGY Radiology interpreted by myself and radiology report reviewed.  No acute pathology identified.    PROCEDURES:  Critical Care performed: No  Procedures   MEDICATIONS ORDERED IN ED: Medications  morphine  (PF) 4 MG/ML injection 4 mg (4 mg Intravenous Given 08/17/24 1519)  acetaminophen  (TYLENOL ) tablet 1,000 mg (1,000 mg Oral Given 08/17/24 1518)  ondansetron  (ZOFRAN ) injection 4 mg (4 mg Intravenous Given 08/17/24 1530)  prochlorperazine  (COMPAZINE ) injection 10 mg (10 mg Intravenous Given 08/17/24 1820)      IMPRESSION / MDM / ASSESSMENT AND PLAN / ED COURSE  I reviewed the triage vital signs and the nursing notes.                              DDX/MDM/AP: Differential diagnosis includes, but is not limited to, consider complication from myelogram including nerve injury, doubt acute cord syndrome.  Doubt UTI, urolithiasis.  Do not clinically suspect fracture.  Plan: - Labs  -n.p.o. - Pain control - Will discuss with neurosurgery  Patient's presentation is most consistent with acute presentation with potential threat to life or bodily function.  The patient is on the cardiac monitor to evaluate for evidence of arrhythmia and/or significant heart rate changes.  ED course below.  Workup unremarkable.  Case discussed with neurosurgery who recommended CT L-spine to evaluate for CSF leak.  No evidence of CSF leak.  Patient feeling much better after single round of morphine  and Tylenol .  Able to ambulate in the emergency department without difficulty.  Followed up with neurosurgery after CT completed, recommend discharge home, they will follow-up with patient by phone early next week and she can continue current plan for outpatient neurosurgery follow-up.  Patient amenable to plan.  Rx Norco, Zofran .  Already has Tylenol  (counseled on cautious use given Rx Norco), Motrin.  No evidence of acute pathology at this time, stable for outpatient follow-up.  ED return precautions in place.   Clinical Course as of 08/17/24 1827  Fri Aug 17, 2024  1502 [facility] paged to discuss [MM]  1513 Discussed with Dr. Claudene, on-call neurosurgeon.  Consider possibility of CSF leak, recommends CT though would not get repeat myelogram at this time. [MM]  1540 CMP reviewed, unremarkable [MM]  1639 CT Lspine: IMPRESSION: 1. No acute findings. No evidence of CSF leak. 2. 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 Minimal  left-sided neural foraminal narrowing at the L3-4 level and minimal bilateral neural foraminal narrowing at the L4-5 level.   [MM]  1639 Urinalysis with no evidence of infection [MM]  1753 Patient reevaluated, resting comfortably in bed.  Notes pain is notably improved but has not ambulated --will trial ambulation. [MM]  1812 D/w Dr. Claudene of [facility] - Given the patient is able to ambulate without difficulty and has otherwise reassuring exam, no indication for further workup at this time, recommends discharge home and follow-up in clinic.  Neurosurgery team will plan to call her early next week to check in [MM]  1816 Patient agrees  with plan, remains very well-appearing here.  Continue with Tylenol  and Motrin which she already has at home, will also prescribe short course of oxycodone.  Plan for outpatient neurosurgery follow-up.  ED return precautions in place.  Patient agrees with plan. [MM]    Clinical Course User Index [MM] Clarine Ozell LABOR, MD     FINAL CLINICAL IMPRESSION(S) / ED DIAGNOSES   Final diagnoses:  Other acute postprocedural pain  Acute midline low back pain with bilateral sciatica     Rx / DC Orders   ED Discharge Orders          Ordered    HYDROcodone -acetaminophen  (NORCO/VICODIN) 5-325 MG tablet  Every 6 hours PRN        08/17/24 1826    ondansetron  (ZOFRAN -ODT) 4 MG disintegrating tablet  Every 8 hours PRN        08/17/24 1827             Note:  This document was prepared using Dragon voice recognition software and may include unintentional dictation errors.   Clarine Ozell LABOR, MD 08/17/24 [phone]

## 2024-08-17 NOTE — ED Notes (Signed)
 Patient transported to CT

## 2024-08-17 NOTE — ED Triage Notes (Signed)
 Presents via EMS from home  States she is having lower back pai   which moves into both upper legs. Also has a h/a   States she had a spinal stimulator removed

## 2024-08-17 NOTE — Telephone Encounter (Signed)
 Please let her know the maximum dose of tylenol  is 4000mg  in 24 hours. She cannot take 1000mg  every 4 hours. She should stop the tylenol .   Is she having any headaches? Are they worse when she stands? Any fevers or chills?   Please let me know.

## 2024-08-17 NOTE — Telephone Encounter (Signed)
 Please let her know that I discussed everything with the PA that did her procedure Denton Hesselbach PA-C) and we are concerned about her severe pain. This is not normal after this procedure.   Because of this, I recommend that she go to ED at [facility] for further evaluation.

## 2024-08-17 NOTE — Telephone Encounter (Signed)
 Patient reports experiencing significant pain following the DG FL LP Inject Myelogram procedure. The pain is centralized in the lower back and radiates outward into the hips, both legs (including the posterior aspect), and buttocks. The patient is unable to sit upright for extended periods and notes that lying down provides relief only after approximately 15 minutes. Standing is difficult due to an inability to fully straighten, and bending exacerbates the discomfort. Additionally, the patient describes a persistent pulling sensation while sitting or standing. Heating pad sometimes helps when laying down. She is needing some type of relief or to be seen if possible, she was provided tylenol  1000 mg every 4 hours.

## 2024-08-17 NOTE — Discharge Instructions (Addendum)
 Your evaluation in the emergency department was overall reassuring, and we saw no evidence of any complications from your recent procedure.  I have prescribed you a stronger pain medication to use as needed for any ongoing discomfort that is not controlled by Motrin, and have also prescribed a nausea medication to use as needed.  I discussed her case with the neurosurgery team, and they will reach out to you to check in early next week.  Please follow-up with them in clinic as well as her primary care provider.  Return to the emergency department with any new or worsening symptoms.

## 2024-08-17 NOTE — Telephone Encounter (Signed)
 I spoke with patient and encouraged her to go to the ER. She states her problem is that she cannot sit up. We offered for her to call EMS. She will call and have the ambulance pick her up.

## 2024-08-17 NOTE — Telephone Encounter (Signed)
 Yesterday she had a slight headache and it was worse when she laid down. Today she has not had a headache. She is not sure what she can take for pain. She had gastric sleeve in 2014 and they do not like her to take much NSAIDS. Any recommendation?

## 2024-08-17 NOTE — ED Notes (Signed)
 Pt ambulatory to bathroom, and around unit with no assistance or distress.

## 2024-08-19 ENCOUNTER — Emergency Department
Admission: EM | Admit: 2024-08-19 | Discharge: 2024-08-19 | Disposition: A | Discharge status: Home / Self Care | Attending: Emergency Medicine | Admitting: Emergency Medicine

## 2024-08-19 ENCOUNTER — Other Ambulatory Visit: Payer: Self-pay

## 2024-08-19 DIAGNOSIS — G971 Other reaction to spinal and lumbar puncture: Secondary | ICD-10-CM | POA: Insufficient documentation

## 2024-08-19 DIAGNOSIS — R519 Headache, unspecified: Secondary | ICD-10-CM | POA: Diagnosis present

## 2024-08-19 LAB — BASIC METABOLIC PANEL WITH GFR
Anion gap: 7 (ref 5–15)
BUN: 11 mg/dL (ref 6–20)
CO2: 26 mmol/L (ref 22–32)
Calcium: 9 mg/dL (ref 8.9–10.3)
Chloride: 108 mmol/L (ref 98–111)
Creatinine, Ser: 1.07 mg/dL — ABNORMAL HIGH (ref 0.44–1.00)
GFR, Estimated: >60 mL/min (ref >60)
Glucose, Bld: 91 mg/dL (ref 70–99)
Potassium: 3.9 mmol/L (ref 3.5–5.1)
Sodium: 141 mmol/L (ref 135–145)

## 2024-08-19 LAB — CBC
HCT: 42.7 % (ref 36.0–46.0)
Hemoglobin: 14 g/dL (ref 12.0–15.0)
MCH: 28.3 pg (ref 26.0–34.0)
MCHC: 32.8 g/dL (ref 30.0–36.0)
MCV: 86.3 fL (ref 80.0–100.0)
Platelets: 242 K/uL (ref 150–400)
RBC: 4.95 MIL/uL (ref 3.87–5.11)
RDW: 13.7 % (ref 11.5–15.5)
WBC: 6.4 K/uL (ref 4.0–10.5)
nRBC: 0 % (ref 0.0–0.2)

## 2024-08-19 MED ORDER — PROCHLORPERAZINE MALEATE 5 MG PO TABS: 5.0000 mg | ORAL_TABLET | Freq: Four times a day (QID) | ORAL | 0 refills | Status: DC | PRN

## 2024-08-19 MED ORDER — BUTALBITAL-APAP-CAFFEINE 50-325-40 MG PO TABS: 1.0000 | ORAL_TABLET | Freq: Four times a day (QID) | ORAL | 0 refills | Status: DC | PRN

## 2024-08-19 MED ORDER — SODIUM CHLORIDE 0.9 % IV SOLN
500.0000 mg | Freq: Once | INTRAVENOUS | Status: AC
  Administered 2024-08-19: 500 mg via INTRAVENOUS
  Filled 2024-08-19: qty 2

## 2024-08-19 MED ORDER — PROCHLORPERAZINE EDISYLATE 10 MG/2ML IJ SOLN
10.0000 mg | Freq: Once | INTRAMUSCULAR | Status: AC
  Administered 2024-08-19: 10 mg via INTRAVENOUS
  Filled 2024-08-19: qty 2

## 2024-08-19 MED ORDER — DIPHENHYDRAMINE HCL 50 MG/ML IJ SOLN
25.0000 mg | Freq: Once | INTRAMUSCULAR | Status: AC
  Administered 2024-08-19: 25 mg via INTRAVENOUS
  Filled 2024-08-19: qty 1

## 2024-08-19 NOTE — ED Notes (Signed)
 ED Provider at bedside.

## 2024-08-19 NOTE — ED Triage Notes (Signed)
 Pt from home [facility] for HA since 3 days which pt believes is a CSF leak because of a CT thoracic myelogram that was done 3 days ago. HA is squeezing and throbbing. Also nausea, photosensitivity and neck pain (full ROM). HA is worse with standing. Pt has spinal stimulator.   Ambulatory when EMS picked her up. EMS VS: HR 80, 139/76, 100% RA.

## 2024-08-19 NOTE — ED Provider Notes (Signed)
 [facility] Provider Note    Event Date/Time   First MD Initiated Contact with Patient 08/19/24 1349     (approximate)   History   Chief Complaint Headache   HPI  Virginia Tucker is a 48 y.o. female with past medical history of migraines, GERD, lumbar herniated disc status post spinal stimulator, and chronic pain syndrome who presents to the ED complaining of headache.  Patient reports that she had a myelogram performed 3 days ago, initially had severe pain in the middle of her lower back and in both hips.  She was seen in the ED the day after the procedure, had imaging of her lumbar spine that was unremarkable.  She was discharged home and pain in her lower back as well as both hips seem to improved, but she describes increasing diffuse throbbing headache over the past 2 days.  She describes nausea and light sensitivity, has felt slightly stiff in her neck but denies any fevers.  She denies any vision changes, speech changes, numbness, or weakness.  She does state that the headache is worse when she tries to stand up, when she also experiences some tingling in both legs.      Physical Exam   Triage Vital Signs: ED Triage Vitals  Encounter Vitals Group     BP 08/19/24 1224 118/77     Girls Systolic BP Percentile --      Girls Diastolic BP Percentile --      Boys Systolic BP Percentile --      Boys Diastolic BP Percentile --      Pulse Rate 08/19/24 1224 74     Resp 08/19/24 1224 15     Temp 08/19/24 1224 98.2 F (36.8 C)     Temp Source 08/19/24 1224 Oral     SpO2 08/19/24 1224 99 %     Weight 08/19/24 1223 159 lb 13.3 oz (72.5 kg)     Height 08/19/24 1223 5' 2 (1.575 m)     Head Circumference --      Peak Flow --      Pain Score 08/19/24 1219 5     Pain Loc --      Pain Education --      Exclude from Growth Chart --     Most recent vital signs: Vitals:   08/19/24 1224  BP: 118/77  Pulse: 74  Resp: 15  Temp: 98.2 F (36.8 C)  SpO2: 99%     Constitutional: Alert and oriented. Eyes: Conjunctivae are normal. Head: Atraumatic. Nose: No congestion/rhinnorhea. Mouth/Throat: Mucous membranes are moist.  Neck: Supple with no meningismus. Cardiovascular: Normal rate, regular rhythm. Grossly normal heart sounds.  2+ radial pulses bilaterally. Respiratory: Normal respiratory effort.  No retractions. Lungs CTAB. Gastrointestinal: Soft and nontender. No distention. Musculoskeletal: No lower extremity tenderness nor edema.  Neurologic:  Normal speech and language. No gross focal neurologic deficits are appreciated.    ED Results / Procedures / Treatments   Labs (all labs ordered are listed, but only abnormal results are displayed) Labs Reviewed  BASIC METABOLIC PANEL WITH GFR - Abnormal; Notable for the following components:      Result Value   Creatinine, Ser 1.07 (*)    All other components within normal limits  CBC    PROCEDURES:  Critical Care performed: No  Procedures   MEDICATIONS ORDERED IN ED: Medications  caffeine -sodium benzoate  ADULT 500 mg in sodium chloride  0.9 % 1,000 mL IVPB (500 mg Intravenous New Bag/Given  08/19/24 1502)  prochlorperazine  (COMPAZINE ) injection 10 mg (10 mg Intravenous Given 08/19/24 1439)  diphenhydrAMINE  (BENADRYL ) injection 25 mg (25 mg Intravenous Given 08/19/24 1439)     IMPRESSION / MDM / ASSESSMENT AND PLAN / ED COURSE  I reviewed the triage vital signs and the nursing notes.                              48 y.o. female with past medical history of migraines, GERD, lumbar herniated disc status post paddle stimulator, and chronic pain syndrome who presents to the ED complaining of worsening headache since myelogram performed 3 days ago.  Patient's presentation is most consistent with acute presentation with potential threat to life or bodily function.  Differential diagnosis includes, but is not limited to, tension headache, migraine headache, SAH, meningitis, post LP  headache.  Patient nontoxic-appearing and in no acute distress, vital signs are unremarkable.  She has a nonfocal neurologic exam and description is most consistent with a post LP headache.  Labs without significant anemia, leukocytosis, electrolyte abnormality, or AKI.  No features to suggest SAH or meningitis.  We will treat with IV Compazine , Benadryl , and caffeine .  Plan to discuss with neurosurgery.  Case discussed with Dr. Claudene of neurosurgery, who agrees that headache today is likely due to recent myelogram and no reason to suspect that this is complicating things with her spinal stimulator.  She did have CT imaging of her lumbar spine 2 days ago without any concerning features, Dr. Claudene does not feel any additional imaging indicated at this time.  Headache improving on reassessment will prescribe Compazine  and Fioricet to assist with ongoing management of headache.  Patient also given contact information for radiology to assist with potential blood patch.  Patient turned over to oncoming provider pending reassessment following completion of symptomatic treatment.      FINAL CLINICAL IMPRESSION(S) / ED DIAGNOSES   Final diagnoses:  Post lumbar puncture headache     Rx / DC Orders   ED Discharge Orders          Ordered    butalbital -acetaminophen -caffeine  (FIORICET) 50-325-40 MG tablet  Every 6 hours PRN        08/19/24 1536    prochlorperazine  (COMPAZINE ) 5 MG tablet  Every 6 hours PRN        08/19/24 1536             Note:  This document was prepared using Dragon voice recognition software and may include unintentional dictation errors.   Willo Dunnings, MD 08/19/24 [phone]

## 2024-08-23 ENCOUNTER — Encounter: Payer: Self-pay | Admitting: Neurosurgery

## 2024-08-23 ENCOUNTER — Telehealth (HOSPITAL_COMMUNITY): Payer: Self-pay | Admitting: Student

## 2024-08-23 ENCOUNTER — Telehealth: Payer: Self-pay | Admitting: Neurosurgery

## 2024-08-23 DIAGNOSIS — R519 Headache, unspecified: Secondary | ICD-10-CM

## 2024-08-23 NOTE — Telephone Encounter (Signed)
 Patient is calling to ask questions following her CT Myelogram last week and her upcoming surgery. She states that she has been laying on the couch with a spinal headache since the myelogram. She states that she has been to the ER twice since her myelogram and is getting a blood patch tomorrow. She does want to make sure that she does not encounter these same symptoms when she has her surgery. Please advise.

## 2024-08-23 NOTE — Progress Notes (Signed)
 Patient for IR Blood Patch on Friday 08/24/24, I called and spoke with the patient on the phone and gave pre-procedure instructions. Pt was made aware to be here at 8a and check in at the new entrance. Pt stated understanding. Called 08/23/24

## 2024-08-23 NOTE — Telephone Encounter (Signed)
 Patient s/p thoracic myelogram at [facility] 08/16/24 with severe, persistent post-LP headache. Patient has been to the ED twice. Patient called IR today stating that the pain is worsening. Patient has now been scheduled for an image-guided blood patch 08/24/24 at [facility].  Warren Dais, AGACNP-BC 08/23/2024, 10:01 AM

## 2024-08-23 NOTE — Telephone Encounter (Signed)
 Responded to patient via mychart. Will call her tomorrow if she has not read the message.

## 2024-08-24 ENCOUNTER — Ambulatory Visit
Admission: RE | Admit: 2024-08-24 | Discharge: 2024-08-24 | Disposition: A | Discharge status: Home / Self Care | Source: Ambulatory Visit | Attending: Student | Admitting: Student

## 2024-08-24 ENCOUNTER — Other Ambulatory Visit: Payer: Self-pay | Admitting: Radiology

## 2024-08-24 DIAGNOSIS — R519 Headache, unspecified: Secondary | ICD-10-CM | POA: Diagnosis present

## 2024-08-24 HISTORY — PX: IR FL GUIDED LOC OF NEEDLE/CATH TIP FOR SPINAL INJECTION LT: IMG2396

## 2024-08-24 MED ORDER — SODIUM CHLORIDE (PF) 0.9 % IJ SOLN
INTRAMUSCULAR | Status: AC
Start: 2024-08-24 — End: 2024-08-24
  Filled 2024-08-24: qty 10

## 2024-08-24 MED ORDER — SODIUM CHLORIDE (PF) 0.9% IJ SOLUTION - NO CHARGE
3.0000 mL | Freq: Once | INTRAMUSCULAR | Status: AC
  Administered 2024-08-24: 3 mL
  Filled 2024-08-24: qty 4

## 2024-08-24 MED ORDER — IOHEXOL 180 MG/ML  SOLN
1.0000 mL | Freq: Once | INTRAMUSCULAR | Status: AC | PRN
Start: 2024-08-24 — End: 2024-08-24
  Administered 2024-08-24: 1 mL via INTRATHECAL

## 2024-08-24 MED ORDER — LIDOCAINE HCL (PF) 1 % IJ SOLN
INTRAMUSCULAR | Status: AC
  Filled 2024-08-24: qty 30

## 2024-08-24 NOTE — Procedures (Signed)
 Interventional Radiology Procedure Note  Procedure: L2-L3 epidural blood patch  Complications: Nonne  Estimated Blood Loss: None  Recommendations: - DC home after 1 hr flat  Signed,  Wilkie LOIS Lent, MD

## 2024-08-24 NOTE — Telephone Encounter (Signed)
 Left message for Mrs. Bhardwaj regarding her concern for potential CSF leak post upcoming spinal stimulation surgery and corresponding headaches. Informed her that we had sent a mychart message and relayed Dr. Elliot response, indicating that he has never had a CSF leak on a spinal cord stimulator surgery before, but that if one were to occur, he would fix it at the time of surgery.

## 2024-08-31 ENCOUNTER — Telehealth: Payer: Self-pay

## 2024-08-31 NOTE — Telephone Encounter (Signed)
 Virginia Tucker called in to report that she went to the ER yesterday and was diagnosed with a DVT of her left upper extremity. They started her on xarelto and advised her to contact our office due to her upcoming surgery and the need to hold xarelto for surgery.  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 She has an appt with [facility] next Thursday.  I reviewed our typical xarelto hold for surgery (stop Xarelto 3 days prior, resume Xarelto 14 days after). I also explained that Dr Clois is typically OK with doing surgery if someone needs to stay on aspirin 81mg  daily. She states the ER also mentioned the possibility of a lovenox bridge to have surgery. She will discuss the above with the hematologist at her appointment next week and then contact us  after.  In the meantime, she has requested that we cancel her surgery until she has guidance from the hematologist. I have notified the OR and reps and canceled her corresponding appointments.

## 2024-09-03 ENCOUNTER — Other Ambulatory Visit

## 2024-09-03 ENCOUNTER — Inpatient Hospital Stay: Admission: RE | Admit: 2024-09-03 | Source: Ambulatory Visit

## 2024-09-05 NOTE — Progress Notes (Signed)
 Hematology Consult Note  Referring Physician :  Pcp, None Per Patient  Primary Care Physician:   Virginia Joesph Knee, MD  Reason for Consult:  New VTE  Assessment/Recommendations:   Virginia Tucker is a 48 y.o. caucasian female with history of sleep apnea, gastric sleeve surgery who we are seeing in consultation at the request of None Per Patient Pcp for further evaluation and management of superficial vein thrombosis.  1.  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 Imaging on 01/09/21 showed acute obstruction in basilic vein at distal upper arm to near the confluence of brachial veins in proximal upper arm, cephalic vein at ACF to mid upper arm (RIGHT).  VTE risk factors include: 1) RUE Contrast injection, 2) ?COVID19 infection.  Due to proximity to brachial vein, she was treated for 45mo with anticoagulation for this episodes, completing on 04/15/21. She has done well since that time without recurrence until recently. She presented with new symptoms in the LEFT arm following a week with multiple ED visits and traumatic venipunctures. Shortly after traumatic venipunctures in the left arm, she developed a knot in the area which extended. PVL of the LUE on 08/30/24 confirmed acute obstruction in basilic vein at distal upper arm to near the confluence of brachial veins in proximal upper arm, cephalic vein at ACF to mid upper arm. CTA was negative for PE. She was started on Xarelto 15mg  BID x 21 days by the ED. While she has a history of prior SVT, she does not have a history of DVT, and this episode is unlike the last and is not close to the deep system. Therefore, we will plan to reduce her dose to daily. We would typically use 10mg  but as she has obtained 15mg  tabs, she will take these daily and then refill at 10mg  daily. We will plan to treat for 45 days and re-image. This was ordered today to be done prior to follow-up. Notably, her symptoms are already  markedly improved.   Her risk factors for this episode include: 1) Multiple IV sticks, 2) History of superficial vein thrombosis, 3) HRT. We reviewed that given the timing between episodes and provoked superficial nature, we wouldn't necessarily need to change her HRT, which she does not want to discontinue. We did discuss trying to avoid traumatic venipunctures in the future and considering ibuprofen/warm compress at the first sign of inflammation at a puncture site, which may help reduce the likelihood of extending thrombus requiring anticoagulation.   Virginia Tucker does have a planned surgery which was postponed due to her anticoagulation. She states she was told she would need to be off anticoagulation x 2 weeks post-op in addition to pre-op hold. We will plan to push this back until she completes therapy. At that time, recommend discussion with her surgeon regarding prophylaxis given her history and potential risks associated with surgery, weighed against bleeding risks.   CBC on 08/30/24 was WNL. Creatinine on 08/30/24 was 1.02, eGFR 25ml/min. Xarelto is appropriate based on these results. Reviewed avoiding NSAIDs/aspirin while on anticoagulation.   2) CT Finding: CTA noted pulmonary artery enlargement. We discussed this and she has discussed with her PCP who has ordered an echo.   Plans: -  Continue Xarelto but 15mg  daily, then 10mg  daily, total 45 days. -  Repeat PVL ordered for on or around 10/11/24. -  Delay surgery until after completion of anticoagulation. -  In the future, will be cautious with traumatic IV sticks and  consider warm compresses or ibuprofen early. -  Follow-up in ~6 weeks.   History of Present Illness: Virginia Tucker is a 48 y.o. caucasian female with history of sleep apnea, gastric sleeve surgery who we are seeing in consultation at the request of Virginia Tucker for further evaluation of superficial vein thrombosis.  Virginia Tucker recently had an MRI on 01/01/21 and had IV contrast in  the right arm.  She states the arm started hurting and became tender, red, and streaky on 01/04/21.  When she got back to the area (had been traveling) she call ortho and presented to [facility].  She was diagnosed with superficial vein thrombosis in the basilic vein on 7/88/77.  She was started on Xarelto 15mg  BID x21 day which she has started.  Her arm has not yet improved. She has been trying warm compress with a heating pad but it is still very achey.   She notes she was COVID19+ on 12/18/20.  She never had a fever with COVID, had bad congestion, aches, and was very tired but couldn't sleep.  She notes she was diagnosed with a secondary bacterial infection on 01/09/21.  She has a history of gastric sleeve in 2014.   She does have some shortness of breath but thinks this is due to her sinus infection, though she doesn't usually get shortness of breath with sinus infection.  She feels some lightheadedness with standing and strange sensation in her limbs post COVID (like noodles).  She is scheduled for consult with long COVID clinic.  She denies abnormal bruising or unusual bleeding including heavy menses, mucosal bleeding or GI bleeding (melena, hematochezia).  She does not currently take regular NSAIDS or aspirin.  She does take meloxicam daily.    Interval History: Virginia Tucker was last seen in May 2022 for SVT associated with contrast injection. She completed treatment with Xarelto at that time. She was recently seen in the ED for acute obstruction in the cephalic vein at the distal left upper arm to mid forearm. A CTA confirmed there was no associated PE. She notes she had a recent procedure in late September which resulted in a spinal headache. She notes she had IVs placed on 9/18, 9/19, and 9/21, and then again when she went in for a blood patch. She had a large knot that developed after one of the hard sticks on her left arm which then extended down the arm, prompting her visit to the ED for evaluation of  recurrent clot. She was started on Xarelto 15mg  BID in the ED. She notes that the CTA done in the ED mentioned possible PAH and she contacted her PCP who ordered an echo. Her LUE has improved significantly, swelling has gone down a lot. No bleeding issues on the Xarelto including GI bleeding or mucosal bleeding. No other recent surgeries or procedures recently, has delayed upcoming spinal stimulator surgery due to blood clot. She states that the recommendation was that she would be off anticoagulation for 2 weeks after surgery. She does believe she will be fully mobile following surgery and the surgery itself is brief, <2h.   Previous Clots: 2  Clot 1: DVT, RUE, acute obstruction in basilic vein at distal upper arm to near the confluence of brachial veins in proximal upper arm, cephalic vein at ACF to mid upper arm, 01/09/21 presented with swelling, erythema and extremity pain Risk Factors: 1) RUE Contrast injection, 2) ?COVID19 infection Anticoagulant: Yes: Xarelto. Well Tolerated?: Yes.  Clot 2: LUE superficial vein  thrombosis, acute obstruction in the cephalic vein at distal upper arm to mid forearm. 08/30/24 Risk Factors: 1) Multiple IV sticks, 2) History of superficial vein thrombosis, 3) HRT Anticoagulant:  Well Tolerated?: Yes  Relevant General Clotting History: Previous Pregnancies: Yes: 2., clotting during pregnancy? No/ none. Contraceptive Use: 8 years, clotting during prior OCP use? No/ none. History of Trauma/Surgeries: Yes: c/s, gastric sleeve, hysterectomy, no VTE. Family History of Clots: no  Parents?: yes, without VTEs  Children?: 2, without VTE Siblings?:  Sisters: 1, without VTE Brothers: 0 Aunts/Uncles?: No known Smoking History?: non-smoker Known Thrombophilia: No/ none. BMI: Body mass index is 29.84 kg/m.   Visit Diagnoses: 1. Superficial vein thrombosis    Past Medical History:  Diagnosis Date  . Anesthesia complication   . Anxiety   . Deep vein thrombosis     (CMS-HCC) 12/2020   Due to Covid  . Depression 1997  . Gall bladder disease   . GERD (gastroesophageal reflux disease)   . Headache(784.0)   . Impingement syndrome of right shoulder   . Lumbar radiculopathy   . Morbid obesity (CMS-HCC)   . OSA on CPAP   . PONV (postoperative nausea and vomiting)   . Vitamin B12 deficiency anemia   . Vitamin D deficiency     Medical History:  Past Medical History:  Diagnosis Date  . Anesthesia complication   . Anxiety   . Deep vein thrombosis    (CMS-HCC) 12/2020   Due to Covid  . Depression 1997  . Gall bladder disease   . GERD (gastroesophageal reflux disease)   . Headache(784.0)   . Impingement syndrome of right shoulder   . Lumbar radiculopathy   . Morbid obesity (CMS-HCC)   . OSA on CPAP   . PONV (postoperative nausea and vomiting)   . Vitamin B12 deficiency anemia   . Vitamin D deficiency      Surgical History: Past Surgical History:  Procedure Laterality Date  . BARIATRIC SURGERY  2014  . CESAREAN SECTION  05/1996  . CESAREAN SECTION  06/2004   bikini  . CHOLECYSTECTOMY  2000   laparoscopic  . HYSTERECTOMY  2007   partial  . PR COLSC FLX W/RMVL OF TUMOR POLYP LESION SNARE TQ Left 02/22/2024   Procedure: COLONOSCOPY FLEX; W/REMOV TUMOR/LES BY SNARE;  Surgeon: Lauretha Jacques Lenis, MD;  Location: GI PROCEDURES [facility];  Service: Gastroenterology  . PR ESOPHAGEAL MOTILITY STUDY, MANOMETRY N/A 08/27/2020   Procedure: ESOPHAGEAL MOTILITY STUDY W/INT & REP;  Surgeon: Nurse-Based Giproc;  [facility];  Service: Gastroenterology  . PR GERD TST W/ MUCOS IMPEDE ELECTROD,>1HR N/A 08/27/2020   Procedure: ESOPHAGEAL FUNCTION TEST, GASTROESOPHAGEAL REFLUX TEST W/ NASAL CATHETER INTRALUMINAL IMPEDANCE ELECTRODE(S) PLACEMENT, RECORDING, ANALYSIS AND INTERPRETATION; PROLONGED;  Surgeon: Nurse-Based Giproc;  Location: [facility];  Service: Gastroenterology  . PR LAP, GAST RESTRICT PROC,  LONGITUDINAL GASTRECTOMY N/A 10/03/2013   Procedure: LAPAROSCOPY, SURGICAL, GASTRIC RESTRICTIVE PROCEDURE; LONGITUDINAL GASTRECTOMY;  Surgeon: Evalene CHRISTELLA Greet, MD;  Location: MAIN OR [facility];  Service: Gastrointestinal  . PR UPPER GI ENDOSCOPY,BIOPSY N/A 08/29/2020   Procedure: UGI ENDOSCOPY; WITH BIOPSY, SINGLE OR MULTIPLE;  Surgeon: Alphonsa Lav, MD;  Location: [facility];  Service: Gastroenterology  . PR UPPER GI ENDOSCOPY,BIOPSY N/A 02/22/2024   Procedure: UGI ENDOSCOPY; WITH BIOPSY, SINGLE OR MULTIPLE;  Surgeon: Lauretha Jacques Lenis, MD;  Location: GI PROCEDURES [facility];  Service: Gastroenterology  . PR UPPER GI ENDOSCOPY,DIAGNOSIS  01/28/2014   Procedure: UGI ENDO,  INCLUDE ESOPHAGUS, STOMACH, & DUODENUM &/OR JEJUNUM; DX W/WO COLLECTION SPECIMN, BY BRUSH OR WASH;  Surgeon: Krystal VEAR Matter, MD;  [facility];  Service: Gastroenterology  . spinal surgery    . TUBAL LIGATION  2006   lap (umbilicus)     Medications:  Current Outpatient Medications  Medication Sig Dispense Refill  . ALPRAZolam (XANAX) 0.5 MG tablet TAKE 1/2 TO 1 TABLET(0.25 TO 0.5 MG) BY MOUTH TWICE DAILY AS NEEDED FOR SLEEP OR ANXIETY Strength: 0.5 mg 60 tablet 0  . buPROPion (WELLBUTRIN XL) 150 MG 24 hr tablet Take 1 tablet (150 mg total) by mouth every morning. 90 tablet 3  . DULoxetine (CYMBALTA) 30 MG capsule Take 1 capsule (30 mg total) by mouth two (2) times a day.    . estradiol (VIVELLE-DOT) 0.025 mg/24 hr Place 1 patch on the skin Two (2) times a week. 8 patch 12  . famotidine (PEPCID) 20 MG tablet Take 1 tablet (20 mg total) by mouth Two (2) times a day. 60 tablet 1  . fluticasone propionate (FLONASE) 50 mcg/actuation nasal spray 1 spray into each nostril daily. 1 g 5  . linaclotide (LINZESS) 72 mcg capsule Take 1 capsule (72 mcg total) by mouth daily. 90 capsule 3  . omeprazole (PRILOSEC) 40 MG capsule TAKE 1 CAPSULE(40 MG) BY MOUTH TWICE DAILY 180 capsule 1  .  pregabalin (LYRICA) 50 MG capsule Take 1 capsule (50 mg total) by mouth two (2) times a day. 60 capsule 5  . rivaroxaban (XARELTO) 15 mg Tab Take 1 tablet (15 mg total) by mouth in the morning and 1 tablet (15 mg total) in the evening. Take with meals. Do all this for 21 days. 42 tablet 0  . semaglutide, weight loss, (WEGOVY) 1 mg/0.5 mL injection pen Inject 1 mg under the skin every seven (7) days. 2 mL 0  . topiramate (TOPAMAX) 100 MG tablet Take 1 tablet (100 mg total) by mouth daily. TAKE 1 TABLET(100 MG) BY MOUTH DAILY 90 tablet 3  . traZODone (DESYREL) 100 MG tablet Take 1 tablet (100 mg total) by mouth nightly. 90 tablet 3  . WEGOVY 0.5 MG/0.5 ML SUBCUTANEOUS PEN INJECTOR INJECT 0.5 MG UNDER THE SKIN EVERY 7 DAYS 2 mL 0  . cetirizine (ZYRTEC) 10 MG tablet Take 1 tablet (10 mg total) by mouth daily. (Patient not taking: Reported on 09/06/2024)    . FLUoxetine (PROZAC) 20 MG tablet Take 0.5 tablets (10 mg total) by mouth daily for 7 days, THEN 1 tablet (20 mg total) daily. (Patient not taking: Reported on 09/06/2024) 100 tablet 3  . rivaroxaban (XARELTO) 10 mg tablet Take 1 tablet (10 mg total) by mouth daily with evening meal. 30 tablet 0   No current facility-administered medications for this visit.     Allergies: Sulfa (sulfonamide antibiotics), Nitrofurantoin, Nitrofurantoin monohyd/m-cryst, and Oxycodone   Social History: Virginia Tucker was born in [place] and currently resides in [place], [place].  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  She lives with her husband and two boys.  No real hobbies lately, used to enjoy watching her son play baseball.  She is a non-smoker Alcohol history: social drinker  Family History: family history includes Arthritis in her mother; COPD in her maternal grandmother; Cancer in her maternal aunt and maternal aunt; Cataracts in her maternal grandmother; Diabetes in her paternal grandmother and sister; Hyperlipidemia in her mother and  sister; No Known Problems in her brother, daughter,  father, maternal grandfather, maternal uncle, paternal aunt, paternal grandfather, paternal uncle, son, son, and another family member; Polycystic ovary syndrome in her sister; Stroke in her maternal grandmother.  Detailed thrombosis family history above in HPI.  Review of Systems: As per HPI, otherwise negative x 12 systems.  Objective: Vitals:   09/06/24 0937  BP: 103/70  BP Position: Sitting  Pulse: 79  Temp: 36.3 C (97.4 F)  TempSrc: Temporal  SpO2: 98%  Weight: 74 kg (163 lb 3.2 oz)  Height: 157.5 cm (5' 2.01)     Physical Exam: GEN: Well-appearing female in no acute distress. HEENT: Moist mucous membranes, normocephalic. NEURO: A&Ox3, CNII-XII grossly intact. PSY: Appropriate affect, reasonable insight and judgment. Extremities: No discoloration or swelling of the left arm, no palpable cord.  Skin: Dry, warm, no rashes.  Test Results Results for orders placed or performed during the hospital encounter of 08/30/24  Comprehensive Metabolic Panel  Result Value Ref Range   Sodium 142 135 - 145 mmol/L   Potassium 3.9 3.4 - 4.8 mmol/L   Chloride 107 98 - 107 mmol/L   CO2 22.4 20.0 - 31.0 mmol/L   Anion Gap 13 5 - 14 mmol/L   BUN 9 9 - 23 mg/dL   Creatinine 8.97 9.44 - 1.02 mg/dL   BUN/Creatinine Ratio 9    eGFR CKD-EPI (2021) Female 68 >=60 mL/min/1.30m2   Glucose 92 70 - 179 mg/dL   Calcium 8.8 8.7 - 89.5 mg/dL   Albumin 3.6 3.4 - 5.0 g/dL   Total Protein 6.4 5.7 - 8.2 g/dL   Total Bilirubin 0.4 0.3 - 1.2 mg/dL   AST 17 <=65 U/L   ALT 12 10 - 49 U/L   Alkaline Phosphatase 64 46 - 116 U/L  Protime-INR  Result Value Ref Range   PT 11.8 9.9 - 12.6 sec   INR 1.04   hCG, Quantitative, Pregnancy  Result Value Ref Range   hCG Quantitative 3.2 mIU/mL  APTT  Result Value Ref Range   APTT 30.8 24.8 - 38.4 sec   Heparin Correlation 0.2   hsTroponin I (single, no delta)  Result Value Ref Range   hsTroponin I <3  <=34 ng/L  Pro-BNP  Result Value Ref Range   PRO-BNP 118.0 <=300.0 pg/mL  ECG 12 Lead  Result Value Ref Range   EKG Systolic BP  mmHg   EKG Diastolic BP  mmHg   EKG Ventricular Rate 85 BPM   EKG Atrial Rate 85 BPM   EKG P-R Interval 146 ms   EKG QRS Duration 76 ms   EKG Q-T Interval 402 ms   EKG QTC Calculation 478 ms   EKG Calculated P Axis 45 degrees   EKG Calculated R Axis 10 degrees   EKG Calculated T Axis 16 degrees   QTC Fredericia 451 ms  CBC w/ Differential  Result Value Ref Range   WBC 7.1 3.6 - 11.2 10*9/L   RBC 4.54 3.95 - 5.13 10*12/L   HGB 12.9 11.3 - 14.9 g/dL   HCT 61.8 65.9 - 55.9 %   MCV 84.0 77.6 - 95.7 fL   MCH 28.4 25.9 - 32.4 pg   MCHC 33.8 32.0 - 36.0 g/dL   RDW 85.5 87.7 - 84.7 %   MPV 8.1 6.8 - 10.7 fL   Platelet 304 150 - 450 10*9/L   nRBC 0 <=4 /100 WBCs   Neutrophils % 56.5 %   Lymphocytes % 34.7 %   Monocytes % 7.3 %   Eosinophils %  0.8 %   Basophils % 0.7 %   Absolute Neutrophils 4.0 1.8 - 7.8 10*9/L   Absolute Lymphocytes 2.5 1.1 - 3.6 10*9/L   Absolute Monocytes 0.5 0.3 - 0.8 10*9/L   Absolute Eosinophils 0.1 0.0 - 0.5 10*9/L   Absolute Basophils 0.0 0.0 - 0.1 10*9/L  Urinalysis with Microscopy with Culture Reflex  Result Value Ref Range   Color, UA Light Yellow    Clarity, UA Clear    Specific Gravity, UA 1.013 1.003 - 1.030   pH, UA 6.5 5.0 - 9.0   Leukocyte Esterase, UA Negative Negative   Nitrite, UA Negative Negative   Protein, UA Negative Negative   Glucose, UA Negative Negative   Ketones, UA Negative Negative   Urobilinogen, UA <2.0 mg/dL <7.9 mg/dL   Bilirubin, UA Negative Negative   Blood, UA Negative Negative   RBC, UA 1 <=4 /HPF   WBC, UA <1 0 - 5 /HPF   Squam Epithel, UA <1 0 - 5 /HPF   Bacteria, UA Rare (A) None Seen /HPF   Mucus, UA Rare (A) None Seen /HPF   *Note: Due to a large number of results and/or encounters for the requested time period, some results have not been displayed. A complete set of results can  be found in Results Review.   I personally spent 45 minutes face-to-face and non-face-to-face in the care of this patient, which includes all pre, intra, and post visit E/M time on the date of service. All documented time was specific to the E/M visit and does not include any pre, intra, post procedure related time.  Governor Lerner, PA-C Physician Assistant Hematology/Oncology

## 2024-09-10 ENCOUNTER — Encounter: Admission: RE | Payer: Self-pay | Source: Home / Self Care

## 2024-09-10 ENCOUNTER — Ambulatory Visit: Admission: RE | Admit: 2024-09-10 | Source: Home / Self Care | Admitting: Neurosurgery

## 2024-09-10 SURGERY — REMOVAL, SPINAL CORD STIMULATOR, THORACIC
Anesthesia: General

## 2024-09-25 ENCOUNTER — Encounter: Admitting: Orthopedic Surgery

## 2024-09-25 ENCOUNTER — Telehealth: Payer: Self-pay

## 2024-09-25 ENCOUNTER — Other Ambulatory Visit: Payer: Self-pay

## 2024-09-25 DIAGNOSIS — T85192S Other mechanical complication of implanted electronic neurostimulator (electrode) of spinal cord, sequela: Secondary | ICD-10-CM

## 2024-09-25 DIAGNOSIS — Z01818 Encounter for other preprocedural examination: Secondary | ICD-10-CM

## 2024-09-25 DIAGNOSIS — G894 Chronic pain syndrome: Secondary | ICD-10-CM

## 2024-09-25 NOTE — Telephone Encounter (Signed)
 I spoke with Mrs Christianson. She reports her arm is much better. Her hematologist informed her that she would have to be on the xarelto for 45 days before she can come off of it for surgery. In light of that, she would like to wait until January to have surgery.  She confirmed that her insurance is staying the same for January ([employer]).  She also informed me she is no longer taking semaglutide. This was not replaced with anything, but she will be starting vyvanse soon.  I have asked her to contact me if anything changes with her plans to come off the xarelto. Per the note from [facility] Hematology on 09/06/24: Continue Xarelto but 15mg  daily, then 10mg  daily, total 45 days. Repeat PVL ordered for on or around 10/11/24. Delay surgery until after completion of anticoagulation. In the future, will be cautious with traumatic IV sticks and consider warm compresses or ibuprofen early.  Return appointment with [facility] Hematology is scheduled for 10/19/24.   Surgery has been rescheduled to 12/03/24.

## 2024-09-25 NOTE — Telephone Encounter (Signed)
 Planned surgery: spinal cord stimulator removal; thoracic laminectomy for spinal cord stimulator placement    Surgery date: 12/03/2024 at [facility]: [address], [place], [place] [address]) - you will find out your arrival time the business day before your surgery.   Pre-op appointment at [facility] Pre-admit Testing: you will receive a call with a date/time for this appointment. If you are scheduled for an in person appointment, Pre-admit Testing is located on the first floor of the [facility] building, [address], [address]. During this appointment, they will advise you which medications you can take the morning of surgery, and which medications you will need to hold for surgery. Labs (such as blood work, EKG) may be done at your pre-op appointment. You are not required to fast for these labs. Should you need to change your pre-op appointment, please call Pre-admit testing at [phone].     Blood thinners:  Per [facility], temporary xarelto course (due to recent left upper extremity DVT) will be complete well before surgery.  Contact us  if plans change.     Surgical clearance: we will send a clearance form to Joesph Morris, MD. They may wish to see you in their office prior to signing the clearance form. If so, they may call you to schedule an appointment.      Common restrictions after spine surgery: No bending, lifting, or twisting ("BLT"). Avoid lifting objects heavier than 10 pounds for the first 6 weeks after surgery. Where possible, avoid household activities that involve lifting, bending, reaching, pushing, or pulling such as laundry, vacuuming, grocery shopping, and childcare. Try to arrange for help from friends and family for these activities while you heal. Do not drive while taking prescription pain medication. Weeks 6 through 12 after surgery: avoid lifting more than 25 pounds.     How to contact us :  If  you have any questions/concerns before or after surgery, you can reach us  at [phone], or you can send a mychart message. We can be reached by phone or mychart 8am-4pm, Monday-Friday.  *Please note: Calls after 4pm are forwarded to a third party answering service. Mychart messages are not routinely monitored during evenings, weekends, and holidays. Please call our office to contact the answering service for urgent concerns during non-business hours.    If you have FMLA/disability paperwork, please drop it off or fax it to [phone]   Appointments/FMLA & disability paperwork: Reche Hait, & Nichole Registered Nurse/Surgery scheduler: Dianelys Scinto, RN & Katie, RN Certified Medical Assistants: Don, CMA, Elenor, CMA, Damien, CMA, & Auston, [place] Physician Assistants: Lyle Decamp, PA-C, Edsel Goods, PA-C & Glade Boys, PA-C Surgeons: Penne Sharps, MD & Reeves Daisy, MD   [facility] PREADMIT TESTING VISIT and SURGERY INFORMATION SHEET   Now that surgery has been scheduled you can anticipate several phone calls from [facility] services. A pharmacy technician will call you to verify your current list of medications taken at home.               The Pre-Service Center will call to verify your insurance information and to give you billing estimates and information.             The Preadmit Testing Office will be calling to schedule a visit to obtain information for the anesthesia team and provide instructions on preparation for surgery.  What can you expect for the Preadmit Testing Visit: Appointments may be scheduled in-person or by telephone.  If a telephone visit is scheduled, you may be asked to come into the office to have lab tests or other studies performed.   This visit will not be completed any greater than 14 days prior to your surgery.  If your surgery has been scheduled for a future date, please do not be alarmed if we have not contacted you to  schedule an appointment more than a month prior to the surgery date.    Please be prepared to provide the following information during this appointment:            -Personal medical history                                               -Medication and allergy list            -Any history of problems with anesthesia              -Recent lab work or diagnostic studies            -Please notify us  of any needs we should be aware of to provide the best care possible           -You will be provided with instructions on how to prepare for your surgery.    On The Day of Surgery:  You must have a driver to take you home after surgery, you will be asked not to drive for 24 hours following surgery.  Taxi, Gisele and non-medical transport will not be acceptable means of transportation unless you have a responsible individual who will be traveling with you.  Visitors in the surgical area:   2 people will be able to visit you in your room once your preparation for surgery has been completed. During surgery, your visitors will be asked to wait in the Surgery Waiting Area.  It is not a requirement for them to stay, if they prefer to leave and come back.  Your visitor(s) will be given an update once the surgery has been completed.  No visitors are allowed in the initial recovery room to respect patient privacy and safety.  Once you are more awake and transfer to the secondary recovery area, or are transferred to an inpatient room, visitors will again be able to see you.  To respect and protect your privacy: We will ask on the day of surgery who your driver will be and what the contact number for that individual will be. We will ask if it is okay to share information with this individual, or if there is an alternative individual that we, or the surgeon, should contact to provide updates and information. If family or friends come to the surgical information desk requesting information about you, who you have not  listed with us , no information will be given.   It may be helpful to designate someone as the main contact who will be responsible for updating your other friends and family.    PREADMIT TESTING OFFICE: [phone] SAME DAY SURGERY: [phone] We look forward to caring for you before and throughout the process of your surgery.

## 2024-10-23 ENCOUNTER — Encounter: Admitting: Neurosurgery

## 2024-11-20 ENCOUNTER — Other Ambulatory Visit: Payer: Self-pay

## 2024-11-20 ENCOUNTER — Encounter
Admission: RE | Admit: 2024-11-20 | Discharge: 2024-11-20 | Disposition: A | Discharge status: Home / Self Care | Source: Ambulatory Visit | Attending: Neurosurgery | Admitting: Neurosurgery

## 2024-11-20 ENCOUNTER — Inpatient Hospital Stay: Admission: RE | Admit: 2024-11-20 | Source: Ambulatory Visit

## 2024-11-20 DIAGNOSIS — T85192A Other mechanical complication of implanted electronic neurostimulator (electrode) of spinal cord, initial encounter: Secondary | ICD-10-CM | POA: Insufficient documentation

## 2024-11-20 DIAGNOSIS — Z01818 Encounter for other preprocedural examination: Secondary | ICD-10-CM | POA: Insufficient documentation

## 2024-11-20 HISTORY — DX: Other complications of anesthesia, initial encounter: T88.59XA

## 2024-11-20 HISTORY — DX: Other mechanical complication of implanted electronic neurostimulator of spinal cord electrode (lead), initial encounter: T85.192A

## 2024-11-20 HISTORY — DX: Radiculopathy, lumbar region: M54.16

## 2024-11-20 HISTORY — DX: Migraine, unspecified, not intractable, without status migrainosus: G43.909

## 2024-11-20 HISTORY — DX: Nausea with vomiting, unspecified: R11.2

## 2024-11-20 HISTORY — DX: Other specified postprocedural states: Z98.890

## 2024-11-20 LAB — SURGICAL PCR SCREEN
MRSA, PCR: NEGATIVE
Staphylococcus aureus: NEGATIVE

## 2024-11-20 LAB — TYPE AND SCREEN
ABO/RH(D): A POS
Antibody Screen: NEGATIVE

## 2024-11-20 NOTE — Patient Instructions (Addendum)
 Your procedure is scheduled on: MONDAY 12/04/23 Report to the Registration Desk on the 1st floor of the [facility]. To find out your arrival time, please call [phone] between 1PM - 3PM on: FRIDAY 12/01/23 If your arrival time is 6:00 am, do not arrive before that time as the [facility] entrance doors do not open until 6:00 am.  REMEMBER: Instructions that are not followed completely may result in serious medical risk, up to and including death; or upon the discretion of your surgeon and anesthesiologist your surgery may need to be rescheduled.  Do not eat food after midnight the night before surgery.  No gum chewing or hard candies.  You may however, drink CLEAR liquids up to 2 hours before you are scheduled to arrive for your surgery. Do not drink anything within 2 hours of your scheduled arrival time.  Clear liquids include: - water  - apple juice without pulp - gatorade (not RED colors) - black coffee or tea (Do NOT add milk or creamers to the coffee or tea) Do NOT drink anything that is not on this list.  One week prior to surgery: Stop Anti-inflammatories (NSAIDS) such as Advil, Aleve, Ibuprofen, Motrin, Naproxen, Naprosyn and Aspirin based products such as Excedrin, Goody's Powder, BC Powder. Stop ANY OVER THE COUNTER supplements until after surgery.  STOP semaglutide-weight management (WEGOVY) until after surgery.   You may however, continue to take Tylenol  if needed for pain up until the day of surgery.  Continue taking all of your other prescription medications up until the day of surgery.  ON THE DAY OF SURGERY ONLY TAKE THESE MEDICATIONS WITH SIPS OF WATER:  ALPRAZolam (XANAX)  famotidine (PEPCID)  pregabalin (LYRICA)   Use inhalers on the day of surgery and bring to the hospital.  No Alcohol for 24 hours before or after surgery.  No Smoking including e-cigarettes for 24 hours before surgery.  No chewable tobacco products for at least 6 hours before surgery.   No nicotine patches on the day of surgery.  Do not use any recreational drugs for at least a week (preferably 2 weeks) before your surgery.  Please be advised that the combination of cocaine and anesthesia may have negative outcomes, up to and including death. If you test positive for cocaine, your surgery will be cancelled.  On the morning of surgery brush your teeth with toothpaste and water, you may rinse your mouth with mouthwash if you wish. Do not swallow any toothpaste or mouthwash.  Use CHG Soap or wipes as directed on instruction sheet.  Do not wear jewelry, make-up, hairpins, clips or nail polish.  For welded (permanent) jewelry: bracelets, anklets, waist bands, etc.  Please have this removed prior to surgery.  If it is not removed, there is a chance that hospital personnel will need to cut it off on the day of surgery.  Do not wear lotions, powders, or perfumes.   Do not shave body hair from the neck down 48 hours before surgery.  Contact lenses, hearing aids and dentures may not be worn into surgery.  Do not bring valuables to the hospital. [facility] is not responsible for any missing/lost belongings or valuables.   Bring your C-PAP to the hospital in case you may have to spend the night.   Notify your doctor if there is any change in your medical condition (cold, fever, infection).  Wear comfortable clothing (specific to your surgery type) to the hospital.  After surgery, you can help prevent lung  complications by doing breathing exercises.  Take deep breaths and cough every 1-2 hours. Your doctor may order a device called an Incentive Spirometer to help you take deep breaths.  If you are being discharged the day of surgery, you will not be allowed to drive home. You will need a responsible individual to drive you home and stay with you for 24 hours after surgery.   If you are taking public transportation, you will need to have a responsible individual with  you.  Please call the Pre-admissions Testing Dept. at [phone] if you have any questions about these instructions.  Surgery Visitation Policy:  Patients having surgery or a procedure may have two visitors.  Children under the age of 58 must have an adult with them who is not the patient.  Merchandiser, Retail to address health-related social needs:  [email or web address]    Pre-operative 4 CHG Bath Instructions   You can play a key role in reducing the risk of infection after surgery. Your skin needs to be as free of germs as possible. You can reduce the number of germs on your skin by washing with CHG (chlorhexidine gluconate) soap before surgery. CHG is an antiseptic soap that kills germs and continues to kill germs even after washing.   DO NOT use if you have an allergy to chlorhexidine/CHG or antibacterial soaps. If your skin becomes reddened or irritated, stop using the CHG and notify one of our RNs at [phone].   Please shower with the CHG soap starting 4 days before surgery using the following schedule:     Please keep in mind the following:  DO NOT shave, including legs and underarms, starting the day of your first shower.   You may shave your face at any point before/day of surgery.  Place clean sheets on your bed the day you start using CHG soap. Use a clean washcloth (not used since being washed) for each shower. DO NOT sleep with pets once you start using the CHG.   CHG Shower Instructions:  If you choose to wash your hair and private area, wash first with your normal shampoo/soap.  After you use shampoo/soap, rinse your hair and body thoroughly to remove shampoo/soap residue.  Turn the water OFF and apply about 3 tablespoons (45 ml) of CHG soap to a CLEAN washcloth.  Apply CHG soap ONLY FROM YOUR NECK DOWN TO YOUR TOES (washing for 3-5 minutes)  DO NOT use CHG soap on face, private areas, open wounds, or sores.  Pay special attention to  the area where your surgery is being performed.  If you are having back surgery, having someone wash your back for you may be helpful. Wait 2 minutes after CHG soap is applied, then you may rinse off the CHG soap.  Pat dry with a clean towel  Put on clean clothes/pajamas   If you choose to wear lotion, please use ONLY the CHG-compatible lotions on the back of this paper.     Additional instructions for the day of surgery: DO NOT APPLY any lotions, deodorants, cologne, or perfumes.   Put on clean/comfortable clothes.  Brush your teeth.  Ask your nurse before applying any prescription medications to the skin.      CHG Compatible Lotions   Aveeno Moisturizing lotion  Cetaphil Moisturizing Cream  Cetaphil Moisturizing Lotion  Clairol Herbal Essence Moisturizing Lotion, Dry Skin  Clairol Herbal Essence Moisturizing Lotion, Extra Dry Skin  Clairol Herbal Essence Moisturizing Lotion, Normal Skin  Curel Age  Defying Therapeutic Moisturizing Lotion with Alpha Hydroxy  Curel Extreme Care Body Lotion  Curel Soothing Hands Moisturizing Hand Lotion  Curel Therapeutic Moisturizing Cream, Fragrance-Free  Curel Therapeutic Moisturizing Lotion, Fragrance-Free  Curel Therapeutic Moisturizing Lotion, Original Formula  Eucerin Daily Replenishing Lotion  Eucerin Dry Skin Therapy Plus Alpha Hydroxy Crme  Eucerin Dry Skin Therapy Plus Alpha Hydroxy Lotion  Eucerin Original Crme  Eucerin Original Lotion  Eucerin Plus Crme Eucerin Plus Lotion  Eucerin TriLipid Replenishing Lotion  Keri Anti-Bacterial Hand Lotion  Keri Deep Conditioning Original Lotion Dry Skin Formula Softly Scented  Keri Deep Conditioning Original Lotion, Fragrance Free Sensitive Skin Formula  Keri Lotion Fast Absorbing Fragrance Free Sensitive Skin Formula  Keri Lotion Fast Absorbing Softly Scented Dry Skin Formula  Keri Original Lotion  Keri Skin Renewal Lotion Keri Silky Smooth Lotion  Keri Silky Smooth Sensitive Skin  Lotion  Nivea Body Creamy Conditioning Oil  Nivea Body Extra Enriched Lotion  Nivea Body Original Lotion  Nivea Body Sheer Moisturizing Lotion Nivea Crme  Nivea Skin Firming Lotion  NutraDerm 30 Skin Lotion  NutraDerm Skin Lotion  NutraDerm Therapeutic Skin Cream  NutraDerm Therapeutic Skin Lotion  ProShield Protective Hand Cream  Provon moisturizing lotion   How to Use an Incentive Spirometer  An incentive spirometer is a tool that measures how well you are filling your lungs with each breath. Learning to take long, deep breaths using this tool can help you keep your lungs clear and active. This may help to reverse or lessen your chance of developing breathing (pulmonary) problems, especially infection. You may be asked to use a spirometer: After a surgery. If you have a lung problem or a history of smoking. After a long period of time when you have been unable to move or be active. If the spirometer includes an indicator to show the highest number that you have reached, your health care provider or respiratory therapist will help you set a goal. Keep a log of your progress as told by your health care provider. What are the risks? Breathing too quickly may cause dizziness or cause you to pass out. Take your time so you do not get dizzy or light-headed. If you are in pain, you may need to take pain medicine before doing incentive spirometry. It is harder to take a deep breath if you are having pain. How to use your incentive spirometer  Sit up on the edge of your bed or on a chair. Hold the incentive spirometer so that it is in an upright position. Before you use the spirometer, breathe out normally. Place the mouthpiece in your mouth. Make sure your lips are closed tightly around it. Breathe in slowly and as deeply as you can through your mouth, causing the piston or the ball to rise toward the top of the chamber. Hold your breath for 3-5 seconds, or for as long as possible. If the  spirometer includes a coach indicator, use this to guide you in breathing. Slow down your breathing if the indicator goes above the marked areas. Remove the mouthpiece from your mouth and breathe out normally. The piston or ball will return to the bottom of the chamber. Rest for a few seconds, then repeat the steps 10 or more times. Take your time and take a few normal breaths between deep breaths so that you do not get dizzy or light-headed. Do this every 1-2 hours when you are awake. If the spirometer includes a goal marker to show the highest number  you have reached (best effort), use this as a goal to work toward during each repetition. After each set of 10 deep breaths, cough a few times. This will help to make sure that your lungs are clear. If you have an incision on your chest or abdomen from surgery, place a pillow or a rolled-up towel firmly against the incision when you cough. This can help to reduce pain while taking deep breaths and coughing. General tips When you are able to get out of bed: Walk around often. Continue to take deep breaths and cough in order to clear your lungs. Keep using the incentive spirometer until your health care provider says it is okay to stop using it. If you have been in the hospital, you may be told to keep using the spirometer at home. Contact a health care provider if: You are having difficulty using the spirometer. You have trouble using the spirometer as often as instructed. Your pain medicine is not giving enough relief for you to use the spirometer as told. You have a fever. Get help right away if: You develop shortness of breath. You develop a cough with bloody mucus from the lungs. You have fluid or blood coming from an incision site after you cough. Summary An incentive spirometer is a tool that can help you learn to take long, deep breaths to keep your lungs clear and active. You may be asked to use a spirometer after a surgery, if you have  a lung problem or a history of smoking, or if you have been inactive for a long period of time. Use your incentive spirometer as instructed every 1-2 hours while you are awake. If you have an incision on your chest or abdomen, place a pillow or a rolled-up towel firmly against your incision when you cough. This will help to reduce pain. Get help right away if you have shortness of breath, you cough up bloody mucus, or blood comes from your incision when you cough. This information is not intended to replace advice given to you by your health care provider. Make sure you discuss any questions you have with your health care provider. Document Revised: 09/23/2023 Document Reviewed: 09/23/2023 Elsevier Patient Education  2024 [employer].

## 2024-11-30 MED ORDER — METOCLOPRAMIDE HCL 5 MG/ML IJ SOLN
INTRAMUSCULAR | Status: AC
  Filled 2024-11-30: qty 2

## 2024-12-02 MED ORDER — PROPOFOL 1000 MG/100ML IV EMUL
INTRAVENOUS | Status: AC
  Filled 2024-12-02: qty 100

## 2024-12-02 MED ORDER — VANCOMYCIN HCL IN DEXTROSE 1-5 GM/200ML-% IV SOLN
1000.0000 mg | Freq: Once | INTRAVENOUS | Status: AC
  Administered 2024-12-03: 1000 mg via INTRAVENOUS

## 2024-12-02 MED ORDER — FENTANYL CITRATE (PF) 250 MCG/5ML IJ SOLN
INTRAMUSCULAR | Status: AC
  Filled 2024-12-02: qty 5

## 2024-12-02 MED ORDER — MIDAZOLAM HCL 2 MG/2ML IJ SOLN
INTRAMUSCULAR | Status: AC
  Filled 2024-12-02: qty 2

## 2024-12-02 MED ORDER — PROPOFOL 10 MG/ML IV BOLUS
INTRAVENOUS | Status: AC
  Filled 2024-12-02: qty 20

## 2024-12-02 MED ORDER — SODIUM CHLORIDE 0.9 % IV SOLN: INTRAVENOUS | Status: DC

## 2024-12-02 MED ORDER — CHLORHEXIDINE GLUCONATE 0.12 % MT SOLN
15.0000 mL | Freq: Once | OROMUCOSAL | Status: AC
  Administered 2024-12-03: 15 mL via OROMUCOSAL

## 2024-12-02 MED ORDER — LACTATED RINGERS IV SOLN: INTRAVENOUS | Status: DC

## 2024-12-02 MED ORDER — ORAL CARE MOUTH RINSE: 15.0000 mL | Freq: Once | OROMUCOSAL | Status: AC

## 2024-12-03 ENCOUNTER — Encounter: Payer: Self-pay | Admitting: Neurosurgery

## 2024-12-03 ENCOUNTER — Ambulatory Visit: Admitting: Anesthesiology

## 2024-12-03 ENCOUNTER — Encounter
Admission: RE | Disposition: A | Discharge status: Home / Self Care | Payer: Self-pay | Source: Home / Self Care | Attending: Neurosurgery

## 2024-12-03 ENCOUNTER — Other Ambulatory Visit: Payer: Self-pay

## 2024-12-03 ENCOUNTER — Ambulatory Visit
Admission: RE | Admit: 2024-12-03 | Discharge: 2024-12-03 | Disposition: A | Discharge status: Home / Self Care | Attending: Neurosurgery | Admitting: Neurosurgery

## 2024-12-03 ENCOUNTER — Ambulatory Visit

## 2024-12-03 DIAGNOSIS — Y752 Prosthetic and other implants, materials and neurological devices associated with adverse incidents: Secondary | ICD-10-CM | POA: Diagnosis not present

## 2024-12-03 DIAGNOSIS — T85192D Other mechanical complication of implanted electronic neurostimulator (electrode) of spinal cord, subsequent encounter: Secondary | ICD-10-CM | POA: Diagnosis not present

## 2024-12-03 DIAGNOSIS — Z01818 Encounter for other preprocedural examination: Secondary | ICD-10-CM

## 2024-12-03 DIAGNOSIS — G894 Chronic pain syndrome: Secondary | ICD-10-CM | POA: Diagnosis not present

## 2024-12-03 DIAGNOSIS — T85192A Other mechanical complication of implanted electronic neurostimulator (electrode) of spinal cord, initial encounter: Secondary | ICD-10-CM

## 2024-12-03 DIAGNOSIS — T85192S Other mechanical complication of implanted electronic neurostimulator (electrode) of spinal cord, sequela: Secondary | ICD-10-CM

## 2024-12-03 DIAGNOSIS — T85113A Breakdown (mechanical) of implanted electronic neurostimulator, generator, initial encounter: Secondary | ICD-10-CM | POA: Insufficient documentation

## 2024-12-03 HISTORY — PX: REMOVAL, SPINAL CORD STIMULATOR, THORACIC: SHX7633

## 2024-12-03 HISTORY — PX: THORACIC LAMINECTOMY FOR SPINAL CORD STIMULATOR: SHX6887

## 2024-12-03 LAB — ABO/RH: ABO/RH(D): A POS

## 2024-12-03 SURGERY — REMOVAL, SPINAL CORD STIMULATOR, THORACIC
Anesthesia: General

## 2024-12-03 MED ORDER — DIPHENHYDRAMINE HCL 25 MG PO CAPS
ORAL_CAPSULE | ORAL | Status: AC
  Filled 2024-12-03: qty 1

## 2024-12-03 MED ORDER — PHENYLEPHRINE HCL-NACL 20-0.9 MG/250ML-% IV SOLN
INTRAVENOUS | Status: AC
  Filled 2024-12-03: qty 250

## 2024-12-03 MED ORDER — MIDAZOLAM HCL 2 MG/2ML IJ SOLN
INTRAMUSCULAR | Status: AC
  Filled 2024-12-03: qty 2

## 2024-12-03 MED ORDER — FENTANYL CITRATE (PF) 100 MCG/2ML IJ SOLN
INTRAMUSCULAR | Status: DC | PRN
  Administered 2024-12-03 (×2): 50 ug via INTRAVENOUS

## 2024-12-03 MED ORDER — PROPOFOL 1000 MG/100ML IV EMUL
INTRAVENOUS | Status: AC
  Filled 2024-12-03: qty 100

## 2024-12-03 MED ORDER — PROPOFOL 10 MG/ML IV BOLUS
INTRAVENOUS | Status: AC
  Filled 2024-12-03: qty 20

## 2024-12-03 MED ORDER — REMIFENTANIL HCL 1 MG IV SOLR
INTRAVENOUS | Status: AC
  Filled 2024-12-03: qty 1000

## 2024-12-03 MED ORDER — VANCOMYCIN HCL IN DEXTROSE 1-5 GM/200ML-% IV SOLN
INTRAVENOUS | Status: AC
  Filled 2024-12-03: qty 200

## 2024-12-03 MED ORDER — EPHEDRINE SULFATE-NACL 50-0.9 MG/10ML-% IV SOSY
PREFILLED_SYRINGE | INTRAVENOUS | Status: DC | PRN
  Administered 2024-12-03 (×2): 5 mg via INTRAVENOUS

## 2024-12-03 MED ORDER — HYDROCODONE-ACETAMINOPHEN 5-325 MG PO TABS
1.0000 | ORAL_TABLET | ORAL | 0 refills | Status: DC | PRN
  Filled 2024-12-03: qty 30, 5d supply, fill #0

## 2024-12-03 MED ORDER — SENNA 8.6 MG PO TABS
1.0000 | ORAL_TABLET | Freq: Two times a day (BID) | ORAL | 0 refills | Status: AC | PRN
  Filled 2024-12-03: qty 15, 8d supply, fill #0

## 2024-12-03 MED ORDER — HYDROMORPHONE HCL 1 MG/ML IJ SOLN
INTRAMUSCULAR | Status: AC
  Filled 2024-12-03: qty 1

## 2024-12-03 MED ORDER — HYDROMORPHONE HCL 1 MG/ML IJ SOLN
0.2500 mg | INTRAMUSCULAR | Status: DC | PRN
  Administered 2024-12-03 (×2): 0.5 mg via INTRAVENOUS

## 2024-12-03 MED ORDER — ONDANSETRON HCL 4 MG/2ML IJ SOLN
INTRAMUSCULAR | Status: DC | PRN
  Administered 2024-12-03: 4 mg via INTRAVENOUS

## 2024-12-03 MED ORDER — LIDOCAINE HCL (CARDIAC) PF 100 MG/5ML IV SOSY
PREFILLED_SYRINGE | INTRAVENOUS | Status: DC | PRN
  Administered 2024-12-03: 60 mg via INTRAVENOUS

## 2024-12-03 MED ORDER — BUPIVACAINE-EPINEPHRINE (PF) 0.5% -1:200000 IJ SOLN
INTRAMUSCULAR | Status: AC
  Filled 2024-12-03: qty 30

## 2024-12-03 MED ORDER — 0.9 % SODIUM CHLORIDE (POUR BTL) OPTIME
TOPICAL | Status: DC | PRN
  Administered 2024-12-03: 500 mL

## 2024-12-03 MED ORDER — MIDAZOLAM HCL (PF) 2 MG/2ML IJ SOLN
INTRAMUSCULAR | Status: DC | PRN
  Administered 2024-12-03: 2 mg via INTRAVENOUS

## 2024-12-03 MED ORDER — SUCCINYLCHOLINE CHLORIDE 200 MG/10ML IV SOSY
PREFILLED_SYRINGE | INTRAVENOUS | Status: DC | PRN
  Administered 2024-12-03: 140 mg via INTRAVENOUS

## 2024-12-03 MED ORDER — HYDROMORPHONE HCL 1 MG/ML IJ SOLN
INTRAMUSCULAR | Status: DC | PRN
  Administered 2024-12-03: .5 mg via INTRAVENOUS

## 2024-12-03 MED ORDER — IRRISEPT - 450ML BOTTLE WITH 0.05% CHG IN STERILE WATER, USP 99.95% OPTIME
TOPICAL | Status: DC | PRN
  Administered 2024-12-03: 250 mL

## 2024-12-03 MED ORDER — CEFAZOLIN IN SODIUM CHLORIDE 2-0.9 GM/100ML-% IV SOLN
2.0000 g | Freq: Once | INTRAVENOUS | Status: AC
  Administered 2024-12-03: 2 g via INTRAVENOUS

## 2024-12-03 MED ORDER — PROPOFOL 500 MG/50ML IV EMUL
INTRAVENOUS | Status: DC | PRN
  Administered 2024-12-03: 120 ug/kg/min via INTRAVENOUS

## 2024-12-03 MED ORDER — SURGIFLO WITH THROMBIN (HEMOSTATIC MATRIX KIT) OPTIME
TOPICAL | Status: DC | PRN
  Administered 2024-12-03: 1 via TOPICAL

## 2024-12-03 MED ORDER — PROPOFOL 10 MG/ML IV BOLUS
INTRAVENOUS | Status: DC | PRN
  Administered 2024-12-03: 150 mg via INTRAVENOUS

## 2024-12-03 MED ORDER — LIDOCAINE HCL (PF) 2 % IJ SOLN
INTRAMUSCULAR | Status: AC
  Filled 2024-12-03: qty 5

## 2024-12-03 MED ORDER — REMIFENTANIL HCL 1 MG IV SOLR
INTRAVENOUS | Status: DC | PRN
  Administered 2024-12-03: .15 ug/kg/min via INTRAVENOUS

## 2024-12-03 MED ORDER — PHENYLEPHRINE HCL-NACL 20-0.9 MG/250ML-% IV SOLN
INTRAVENOUS | Status: DC | PRN
  Administered 2024-12-03: 20 ug/min via INTRAVENOUS

## 2024-12-03 MED ORDER — METHOCARBAMOL 500 MG PO TABS
500.0000 mg | ORAL_TABLET | Freq: Four times a day (QID) | ORAL | 0 refills | Status: DC | PRN
  Filled 2024-12-03: qty 120, 30d supply, fill #0

## 2024-12-03 MED ORDER — CEFAZOLIN SODIUM-DEXTROSE 2-4 GM/100ML-% IV SOLN
INTRAVENOUS | Status: AC
  Filled 2024-12-03: qty 100

## 2024-12-03 MED ORDER — ONDANSETRON HCL 4 MG/2ML IJ SOLN
INTRAMUSCULAR | Status: AC
  Filled 2024-12-03: qty 2

## 2024-12-03 MED ORDER — GLYCOPYRROLATE 0.2 MG/ML IJ SOLN
INTRAMUSCULAR | Status: DC | PRN
  Administered 2024-12-03: .2 mg via INTRAVENOUS

## 2024-12-03 MED ORDER — DEXAMETHASONE SOD PHOSPHATE PF 10 MG/ML IJ SOLN
INTRAMUSCULAR | Status: DC | PRN
  Administered 2024-12-03: 10 mg via INTRAVENOUS

## 2024-12-03 MED ORDER — CHLORHEXIDINE GLUCONATE 0.12 % MT SOLN
OROMUCOSAL | Status: AC
  Filled 2024-12-03: qty 15

## 2024-12-03 MED ORDER — FENTANYL CITRATE (PF) 100 MCG/2ML IJ SOLN
INTRAMUSCULAR | Status: AC
  Filled 2024-12-03: qty 2

## 2024-12-03 MED ORDER — BUPIVACAINE-EPINEPHRINE (PF) 0.5% -1:200000 IJ SOLN
INTRAMUSCULAR | Status: DC | PRN
  Administered 2024-12-03: 10 mL

## 2024-12-03 SURGICAL SUPPLY — 37 items
BUR NEURO DRILL SOFT 3.0X3.8M (BURR) ×1 IMPLANT
CONTROL REMOTE FREELINK ALPHA (NEUROSURGERY SUPPLIES) IMPLANT
DERMABOND ADVANCED .7 DNX12 (GAUZE/BANDAGES/DRESSINGS) ×2 IMPLANT
DRAPE C ARM PK CFD 31 SPINE (DRAPES) ×1 IMPLANT
DRAPE LAPAROTOMY 100X77 ABD (DRAPES) ×1 IMPLANT
DRSG OPSITE POSTOP 3X4 (GAUZE/BANDAGES/DRESSINGS) IMPLANT
DRSG OPSITE POSTOP 4X6 (GAUZE/BANDAGES/DRESSINGS) IMPLANT
DRSG OPSITE POSTOP 4X8 (GAUZE/BANDAGES/DRESSINGS) IMPLANT
ELECTRODE REM PT RTRN 9FT ADLT (ELECTROSURGICAL) ×1 IMPLANT
FEE INTRAOP CADWELL SUPPLY NCS (MISCELLANEOUS) IMPLANT
FEE INTRAOP MONITOR IMPULS NCS (MISCELLANEOUS) IMPLANT
GLOVE BIOGEL PI IND STRL 6.5 (GLOVE) ×1 IMPLANT
GLOVE SURG SYN 6.5 PF PI (GLOVE) ×1 IMPLANT
GLOVE SURG SYN 8.5 PF PI (GLOVE) ×3 IMPLANT
GOWN SRG LRG LVL 4 IMPRV REINF (GOWNS) ×1 IMPLANT
GOWN SRG XL LVL 3 NONREINFORCE (GOWNS) ×1 IMPLANT
KIT CHARGING PRECISION NEURO (KITS) IMPLANT
KIT IPG ALPHA WAVEWRITER (Stimulator) IMPLANT
KIT SPINAL PRONEVIEW (KITS) ×1 IMPLANT
LAVAGE JET IRRISEPT WOUND (IRRIGATION / IRRIGATOR) ×1 IMPLANT
LEAD COVER EDGE 50CM STIM KIT (Lead) IMPLANT
MANIFOLD NEPTUNE II (INSTRUMENTS) ×1 IMPLANT
MARKER SKIN DUAL TIP RULER LAB (MISCELLANEOUS) ×1 IMPLANT
NDL SAFETY ECLIP 18X1.5 (MISCELLANEOUS) ×1 IMPLANT
NS IRRIG 500ML POUR BTL (IV SOLUTION) ×1 IMPLANT
PACK LAMINECTOMY ARMC (PACKS) ×1 IMPLANT
PASSER ELEVATOR (SPINAL CORD STIMULATOR) IMPLANT
STAPLER SKIN PROX 35W (STAPLE) ×1 IMPLANT
SURGIFLO W/THROMBIN 8M KIT (HEMOSTASIS) ×1 IMPLANT
SUT SILK 2 0SH CR/8 30 (SUTURE) ×1 IMPLANT
SUT STRATA 3-0 15 PS-2 (SUTURE) ×2 IMPLANT
SUT VIC AB 0 CT1 18XCR BRD 8 (SUTURE) ×1 IMPLANT
SUT VIC AB 2-0 CT1 18 (SUTURE) ×1 IMPLANT
SYR 10ML LL (SYRINGE) IMPLANT
SYR 30ML LL (SYRINGE) ×2 IMPLANT
TOOL LONG TUNNEL (SPINAL CORD STIMULATOR) IMPLANT
TRAP FLUID SMOKE EVACUATOR (MISCELLANEOUS) ×1 IMPLANT

## 2024-12-03 NOTE — OR Nursing (Signed)
 Evoke Spinal cord stimulator battery and leads removed by surgeon.

## 2024-12-03 NOTE — Discharge Instructions (Addendum)
 NEUROSURGERY DISCHARGE INSTRUCTIONS  Admission diagnosis: Malfunction of spinal cord stimulator, sequela [T85.192S] Chronic pain syndrome [G89.4]  Operative procedure: Spinal cord stimulator removal and placement  What to do after you leave the hospital:  Recommended diet: regular diet. Increase protein intake to promote wound healing.  Recommended activity: no lifting, driving, or strenuous exercise for 2 weeks. You should walk multiple times per day  Special Instructions  No straining, no heavy lifting > 10lbs x 4 weeks.  Keep incision area clean and dry. May shower in 2 days. No baths or pools for 6 weeks.  Please remove dressing tomorrow, no need to apply a bandage afterwards  You have no sutures to remove, the skin is closed with adhesive  Please take pain medications as directed. Take a stool softener if on pain medications  *Regarding compression stockings-  Please wear day and night until you are walking a couple hundred feet three times a day.   Please Report any of the following: Nausea or Vomiting, Temperature is greater than 101.50F (38.1C) degrees, Dizziness, Abdominal Pain, Difficulty Breathing or Shortness of Breath, Inability to Eat, drink Fluids, or Take medications, Bleeding, swelling, or drainage from surgical incision sites, New numbness or weakness, and Bowel or bladder dysfunction to the neurosurgeon on call. How to contact us :  If you have any questions/concerns before or after surgery, you can reach us  at [phone], or you can send a mychart message. We can be reached by phone or mychart 8am-4pm, Monday-Friday.  *Please note: Calls after 4pm are forwarded to a third party answering service. Mychart messages are not routinely monitored during evenings, weekends, and holidays. Please call our office to contact the answering service for urgent concerns during non-business hours.   Additional Follow up appointments Please follow up with Glade Boys PA-C as scheduled  in 2-3 weeks   Please see below for scheduled appointments:  Future Appointments  Date Time Provider Department Center  12/18/2024  2:30 PM Boys Gl[place], [place] [facility]  01/15/2025  1:45 PM Clois Fret, [place] [facility]

## 2024-12-03 NOTE — Anesthesia Procedure Notes (Signed)
 Procedure Name: Intubation Date/Time: 12/03/2024 12:30 PM  Performed by: Niki Manus SAUNDERS, CRNAPre-anesthesia Checklist: Patient identified, Patient being monitored, Timeout performed, Emergency Drugs available and Suction available Patient Re-evaluated:Patient Re-evaluated prior to induction Oxygen Delivery Method: Circle system utilized Preoxygenation: Pre-oxygenation with 100% oxygen Induction Type: IV induction Ventilation: Mask ventilation without difficulty Laryngoscope Size: McGrath and 4 Grade View: Grade I Tube type: Oral Tube size: 7.0 mm Number of attempts: 1 Airway Equipment and Method: Stylet Placement Confirmation: ETT inserted through vocal cords under direct vision, positive ETCO2 and breath sounds checked- equal and bilateral Secured at: 21 cm Tube secured with: Tape Dental Injury: Teeth and Oropharynx as per pre-operative assessment

## 2024-12-03 NOTE — H&P (Signed)
 "   Referring Physician:  No referring provider defined for this encounter.  Primary Physician:  Jannett Search, Tucker  History of Present Illness: 12/03/2024 Virginia Tucker presents today with continued symptoms.  07/03/2024 Virginia Tucker is here today with a chief complaint of herniated disc at L3-L4 who presents with a malfunctioning spinal cord stimulator and persistent back pain.  She initially herniated her disc and an injury several years ago.  She was evaluated by 2 different spine surgeons, was not felt to be a candidate for spinal surgery at her last evaluation in 2023.  She then had evaluation for spinal cord stimulator.  She had an excellent response to stimulation.  She underwent spinal cord stimulator implantation with the [place] Evoke system in January 2024. The leads did not remain in place, and the anchors dislodged, causing the program to malfunction. A revision surgery in January 2025 did not resolve the issue, with continued lead migration and anchor protrusion. The program fails when the anchors press on certain areas, and the device's effectiveness diminishes with movement.  She experiences persistent back pain, unchanged over the past two years. The spinal cord stimulator provided pain relief during the trial and immediately after implantation, but the relief was not sustained due to the device malfunction. She feels the device move and change intensity, becoming ineffective shortly after adjustments. She desires pain relief and an active lifestyle, especially as a grandmother.  Bowel/Bladder Dysfunction: none  Conservative measures:  Physical therapy:  has not participated in recently Multimodal medical therapy including regular antiinflammatories:  Cymbalta, Gabapentin, Medrol , Lyrica, Tizanidine Injections:  05/20/21-SI Right L4-5, L5-S1 and Left L4-5, L5-S1  11/04/21-Right Facet Joint Injection    Past Surgery:  11/04/22 and 01/27/23--IMPLANTATION PERCUTANEOUS EPIDURAL  NEUROSTIMULATORY ELECTRODES TRIAL  01/27/23--INCISION FOR IMPLANTATION/REPLACEMENT SPINAL NEUROSTIMULATOR  12/13/23--INSERTION REPLACEMENT OF SPINAL PERCUTANEOUS ELECTRODE ARRAY    Virginia Tucker has no symptoms of cervical myelopathy.  The symptoms are causing a significant impact on the patient's life.   I have utilized the care everywhere function in epic to review the outside records available from external health systems.  Review of Systems:  A 10 point review of systems is negative, except for the pertinent positives and negatives detailed in the HPI.  Past Medical History: Past Medical History:  Diagnosis Date   Complication of anesthesia    Low BP, Low O2 after anesthesia   GERD (gastroesophageal reflux disease)    Lumbar herniated disc    Lumbar radiculopathy    Malfunction of spinal cord stimulator    Migraines    PONV (postoperative nausea and vomiting)     Past Surgical History: Past Surgical History:  Procedure Laterality Date   ABDOMINAL HYSTERECTOMY     CESAREAN SECTION     x2   CHOLECYSTECTOMY  2006   galbladder     IMPLANTATION / PLACEMENT EPIDURAL NEUROSTIMULATOR ELECTRODES     11/04/22 and 01/27/23   IR FL GUIDED LOC OF NEEDLE/CATH TIP FOR SPINAL INJECTION LT  08/24/2024   LAPAROSCOPIC GASTRIC SLEEVE RESECTION  2014    Allergies: Allergies as of 09/25/2024 - Review Complete 08/29/2024  Allergen Reaction Noted   Nitrofurantoin Other (See Comments) 03/30/2022   Oxycodone Itching 08/25/2018   Sulfa antibiotics Hives and Dermatitis 04/03/2013    Medications:  Current Facility-Administered Medications:    0.9 %  sodium chloride  infusion, , Intravenous, Continuous, McInnis, Caitlyn M, PA-C   ceFAZolin  (ANCEF ) IVPB 2g/100 mL premix, 2 g, Intravenous, Once, Clois Fret, Tucker  lactated ringers  infusion, , Intravenous, Continuous, Mazzoni, Andrea, Tucker  Social History: Social History   Tobacco Use   Smoking status: Never   Smokeless tobacco: Never   Vaping Use   Vaping status: Every Day   Substances: Nicotine  Substance Use Topics   Alcohol use: Yes    Comment: Occasionally   Drug use: Never    Family Medical History: History reviewed. No pertinent family history.  Physical Examination: Vitals:   12/03/24 1010 12/03/24 1011  BP:  104/64  Pulse: 91   Resp: 18   Temp: 97.6 F (36.4 C)   SpO2: 97%    Heart sounds normal no MRG. Chest Clear to Auscultation Bilaterally.  General: Patient is in no apparent distress. Attention to examination is appropriate.  Neck:   Supple.  Full range of motion.  Respiratory: Patient is breathing without any difficulty.   NEUROLOGICAL:     Awake, alert, oriented to person, place, and time.  Speech is clear and fluent.   Cranial Nerves: Pupils equal round and reactive to light.  Facial tone is symmetric.  Facial sensation is symmetric. Shoulder shrug is symmetric. Tongue protrusion is midline.  There is no pronator drift.  Strength: Side Biceps Triceps Deltoid Interossei Grip Wrist Ext. Wrist Flex.  R 5 5 5 5 5 5 5   L 5 5 5 5 5 5 5    Side Iliopsoas Quads Hamstring PF DF EHL  R 5 5 5 5 5 5   L 5 5 5 5 5 5    Reflexes are 1+ and symmetric at the biceps, triceps, brachioradialis, patella and achilles.   Hoffman's is absent.   Bilateral upper and lower extremity sensation is intact to light touch.    No evidence of dysmetria noted.  Gait is untested.     Medical Decision Making  Imaging: Xray imaging reviewed Jan 2025 - one of the leads is migrated inferiorly  I have personally reviewed the images and agree with the above interpretation.  Assessment and Plan: [name] is a pleasant 49 y.o. female with chronic pain syndrome who has previously had relief with a spinal cord stimulator.  She has now had 2 different malfunctions of her spinal cord stimulator with lead migration.  We will remove her prior device and place a new device via thoracic laminectomy.     Thank you  for involving me in the care of this patient.      Virginia Tucker, [facility]  "

## 2024-12-03 NOTE — Op Note (Signed)
 Indications: the patient is a 49 yo female who was diagnosed with T85.192S Malfunction of spinal cord stimulator, sequela, G89.4 Chronic pain syndrome . The patient had a successful trial previously, but had a malfunction in her stimulator and lead migration.   Findings: successful placement of a [employer] spinal cord stimulator.    Preoperative Diagnosis: T85.192S Malfunction of spinal cord stimulator, sequela, G89.4 Chronic pain syndrome  Postoperative Diagnosis: same     EBL: 50 ml IVF: see anesthesia record Drains: none Disposition: Extubated and Stable to PACU Complications: none   No foley catheter was placed.     Preoperative Note:    Risks of surgery discussed in clinic.   Operative Note:      The patient was then brought from the preoperative center with intravenous access established.  The patient underwent general anesthesia and endotracheal tube intubation, then was rotated on the [employer] table where all pressure points were appropriately padded.    The prior incisions were identified.   An incision was marked with flouroscopy at T8/9, and on the left flank. The skin was then thoroughly cleansed.  Perioperative antibiotic prophylaxis was administered.  Sterile prep and drapes were then applied and a timeout was then observed.    The prior incision over the prior stimulator battery was opened and the battery was identified.  It was carefully dissected and removed.  The leads were divided.The old incision where the prior stimulator was secured was opened and the stays were identified.    Once this was complete, an incision was opened with the use of a #10 blade knife in the midline for the paddle placement.  The paraspinus muscled were subperiosteally dissected until the laminae of T9 and T10 were visualized. Flouroscopy was used to confirm the level. A self-retaining retractor was placed.  The prior leads were then carefully removed.  The contacts were counted to  confirm all contacts were removed.   The rongeur was used to remove the spinous process of T9.  The drill was used to thin the bone until the ligamentum flavum was visualized.  The ligamentum was then removed and the dura visualized. This was widened until placement of the paddle lead was possible.     The lead was then advanced to the T7/8 disc space at the top of the lead.  The lead was secured with a 2-0 silk suture.     The incision on the flank was then opened and a pocket formed until it was large enough for the pulse generator.  The tunneler was used to connect between the pocket and the incision.  The lead was inserted into the tunneler and tunneled to the flank.  The leads were attached to the IPG and impedances checked.  The leads were then tightened.  The IPG was then inserted into the pouch.   Both sites were irrigated.  The wounds were closed in layers with 0 and 2-0 vicryl.  The skin was approximated with monocryl. A sterile dressing was applied.  The prior incisions were irrigated and closed.   Monitoring was stable throughout.   Patient was then rotated back to the preoperative bed awakened from anesthesia and taken to recovery. All counts are correct in this case.   I performed the entire procedure with the assistance of Edsel Goods PA as an designer, television/film set. An assistant was required for this procedure due to the complexity.  The assistant provided assistance in tissue manipulation and suction, and was required for the successful  and safe performance of the procedure. I performed the critical portions of the procedure.      Reeves Daisy MD

## 2024-12-03 NOTE — Progress Notes (Signed)
 Patient walked to the bathroom urinated and has had a drink and crackers.

## 2024-12-03 NOTE — Anesthesia Preprocedure Evaluation (Signed)
"                                    Anesthesia Evaluation  Patient identified by MRN, date of birth, ID band Patient awake    Reviewed: Allergy & Precautions, NPO status , Patient's Chart, lab work & pertinent test results  History of Anesthesia Complications (+) PONV and history of anesthetic complications  Airway Mallampati: III  TM Distance: >3 FB Neck ROM: full    Dental no notable dental hx.    Pulmonary sleep apnea    Pulmonary exam normal        Cardiovascular negative cardio ROS Normal cardiovascular exam     Neuro/Psych  Headaches PSYCHIATRIC DISORDERS Anxiety Depression     Neuromuscular disease    GI/Hepatic Neg liver ROS,GERD  Controlled,,  Endo/Other  negative endocrine ROS    Renal/GU      Musculoskeletal   Abdominal   Peds  Hematology  (+) Blood dyscrasia, anemia   Anesthesia Other Findings Past Medical History: No date: Complication of anesthesia     Comment:  Low BP, Low O2 after anesthesia No date: GERD (gastroesophageal reflux disease) No date: Lumbar herniated disc No date: Lumbar radiculopathy No date: Malfunction of spinal cord stimulator No date: Migraines No date: PONV (postoperative nausea and vomiting)  Past Surgical History: No date: ABDOMINAL HYSTERECTOMY No date: CESAREAN SECTION     Comment:  x2 2006: CHOLECYSTECTOMY No date: galbladder No date: IMPLANTATION / PLACEMENT EPIDURAL NEUROSTIMULATOR ELECTRODES     Comment:  11/04/22 and 01/27/23 08/24/2024: IR FL GUIDED LOC OF NEEDLE/CATH TIP FOR SPINAL INJECTION  LT 2014: LAPAROSCOPIC GASTRIC SLEEVE RESECTION  BMI    Body Mass Index: 29.90 kg/m      Reproductive/Obstetrics negative OB ROS                              Anesthesia Physical Anesthesia Plan  ASA: 3  Anesthesia Plan: General ETT   Post-op Pain Management: Toradol IV (intra-op)* and Ofirmev  IV (intra-op)*   Induction: Intravenous  PONV Risk Score and Plan: 4 or  greater and Ondansetron , Dexamethasone , Midazolam , Treatment may vary due to age or medical condition, Propofol  infusion and TIVA  Airway Management Planned: Oral ETT  Additional Equipment:   Intra-op Plan:   Post-operative Plan: Extubation in OR  Informed Consent: I have reviewed the patients History and Physical, chart, labs and discussed the procedure including the risks, benefits and alternatives for the proposed anesthesia with the patient or authorized representative who has indicated his/her understanding and acceptance.     Dental Advisory Given  Plan Discussed with: Anesthesiologist, CRNA and Surgeon  Anesthesia Plan Comments: (Patient consented for risks of anesthesia including but not limited to:  - adverse reactions to medications - damage to eyes, teeth, lips or other oral mucosa - nerve damage due to positioning  - sore throat or hoarseness - Damage to heart, brain, nerves, lungs, other parts of body or loss of life  Patient voiced understanding and assent.)        Anesthesia Quick Evaluation  "

## 2024-12-03 NOTE — Transfer of Care (Signed)
 Immediate Anesthesia Transfer of Care Note  Patient: Virginia Tucker  Procedure(s) Performed: REMOVAL, SPINAL CORD STIMULATOR, THORACIC THORACIC LAMINECTOMY FOR SPINAL CORD STIMULATOR  Patient Location: PACU  Anesthesia Type:General  Level of Consciousness: drowsy  Airway & Oxygen Therapy: Patient Spontanous Breathing and Patient connected to nasal cannula oxygen  Post-op Assessment: Report given to RN and Post -op Vital signs reviewed and stable  Post vital signs: Reviewed and stable  Last Vitals:  Vitals Value Taken Time  BP 101/72 12/03/24 14:40  Temp 36.2 C 12/03/24 14:40  Pulse 102 12/03/24 14:41  Resp 21 12/03/24 14:41  SpO2 100 % 12/03/24 14:41  Vitals shown include unfiled device data.  Last Pain:  Vitals:   12/03/24 1010  TempSrc: Temporal  PainSc: 2          Complications: No notable events documented.

## 2024-12-04 ENCOUNTER — Encounter: Payer: Self-pay | Admitting: Neurosurgery

## 2024-12-04 ENCOUNTER — Other Ambulatory Visit: Payer: Self-pay

## 2024-12-04 DIAGNOSIS — T85192S Other mechanical complication of implanted electronic neurostimulator (electrode) of spinal cord, sequela: Secondary | ICD-10-CM

## 2024-12-04 DIAGNOSIS — Z09 Encounter for follow-up examination after completed treatment for conditions other than malignant neoplasm: Secondary | ICD-10-CM

## 2024-12-04 MED ORDER — HYDROMORPHONE HCL 2 MG PO TABS
2.0000 mg | ORAL_TABLET | ORAL | 0 refills | Status: DC | PRN
  Filled 2024-12-04: qty 30, 7d supply, fill #0

## 2024-12-04 NOTE — Telephone Encounter (Signed)
 Stop hydrocodone . PMP reviewed and is appropriate.   Dilaudid  sent to [facility] pharmacy.   Please keep us  updated any further itching or side effects. Should hopefully resolve once hydrocodone  is out of her system.

## 2024-12-04 NOTE — Telephone Encounter (Signed)
 DOS: 12/03/24 spinal cord stimulator removal; thoracic laminectomy for spinal cord stimulator placement with Dr. Clois  Patient called with complaint of generalized body itching. She said the location changes and has been in her stomach, elbow, head, etc. States that this has happened before with pain medication, specifically oxycodone. Has not happened prior with hydrocodone . Unsure if she has had dilaudid  before or if this has occurred with dilaudid . States she had dilaudid  in the hospital yesterday and came home from the hospital itching. Has had fentanyl  prior with no reaction.   Patient asked if she could take benadryl  for itching. Advised her not to take benadryl , hydrocodone , and robaxin  together. Patient to take benadryl  now and wait for further instruction.   Do we need to make med changes or do we have advice for itching?  Pharmacy - [facility] on [address]

## 2024-12-04 NOTE — Telephone Encounter (Signed)
 I spoke with Virginia Tucker and relayed [name]'s recommendations. I advised her to call our department for any urgent after hours concerns, if needed.

## 2024-12-04 NOTE — Addendum Note (Signed)
 Addended byBETHA HILMA HASTINGS on: 12/04/2024 02:27 PM   Modules accepted: Orders

## 2024-12-04 NOTE — Telephone Encounter (Signed)
 Does the itching seem worse after she takes hydrocodone ?   Does she notice anything when she takes robaxin ?   If we think itching is worse with hydrocodone , I would stop it.   She cannot take oxycodone due to itching.   She can continue prn benadryl  as directed on the package.   Only other medication left is dilaudid . If she wants to do this, would send to [facility] pharmacy.   Let me know.

## 2024-12-06 NOTE — Anesthesia Postprocedure Evaluation (Signed)
"   Anesthesia Post Note  Patient: Virginia Tucker  Procedure(s) Performed: REMOVAL, SPINAL CORD STIMULATOR, THORACIC THORACIC LAMINECTOMY FOR SPINAL CORD STIMULATOR  Anesthesia Type: General Anesthetic complications: no   No notable events documented.   Last Vitals:  Vitals:   12/03/24 1530 12/03/24 1549  BP: 105/74 106/74  Pulse: 93 (!) 106  Resp: 15 16  Temp: (!) 36.4 C 36.6 C  SpO2: 95% 96%    Last Pain:  Vitals:   12/03/24 1549  TempSrc: Temporal  PainSc:                  Virginia Tucker      "

## 2024-12-14 NOTE — Progress Notes (Unsigned)
" ° °  REFERRING PHYSICIAN:  Jannett Search, Md [address] [place],  [place] [address]  DOS: 12/03/24  removal and subsequent successful placement of a [employer] spinal cord stimulator   HISTORY OF PRESENT ILLNESS: Virginia Tucker is approximately 2 weeks status post above surgery. Was given hydrocodone  on discharge from the hospital.   She called on 12/04/24 with itching. Hydrocodone  stopped and dilaudid  was sent to her pharmacy.   She had more itching with dilaudid  and stopped it. Went back to hydrocodone  and did not itch. She still has some of this at home.   She was doing well until last Thursday- she tried to get up from sitting and felt a pulling in her back. Since that time, she's noted increased swelling with pain and tenderness left side of her mid back.   She has been taking robaxin  and prn tylenol .   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 She started back at work today and is working from home.   PHYSICAL EXAMINATION:  General: Patient is well developed, well nourished, calm, collected, and in no apparent distress.   NEUROLOGICAL:  General: In no acute distress.   Awake, alert, oriented to person, place, and time.  Pupils equal round and reactive to light.  Facial tone is symmetric.     Strength:            Side Iliopsoas Quads Hamstring PF DF EHL  R 5 5 5 5 5 5   L 5 5 5 5 5 5    Incisions  c/d/i  She has some mild tenderness below and to left of her upper incision. I don't appreciate any significant swelling.   ROS (Neurologic):  Negative except as noted above  IMAGING: Nothing new to review.   ASSESSMENT/PLAN:  Virginia Tucker is doing fair s/p above surgery.  She's had some increased pain/pulling in left side of mid back since Thursday. Treatment options reviewed with patient and following plan made:   - I have advised the patient to lift up to 10 pounds until 6 weeks after surgery (follow up with Dr. Clois).  - Reviewed wound care.  - No  bending, twisting, or lifting.  - Continue on current medications including prn robaxin  and prn tylenol . She has hydrocodone  at home to take if needed.   [employer] met with her for programming. Everything was intact and working correctly- she was able to get good coverage.   - Okay to return back to desk job and working from home. Will let us  know if any issues. Do not take narcotics while working.  - Follow up as scheduled in 4 weeks and prn.   Advised to contact the office if any questions or concerns arise.  Glade Boys PA-C Department of neurosurgery "

## 2024-12-18 ENCOUNTER — Encounter: Payer: Self-pay | Admitting: Orthopedic Surgery

## 2024-12-18 ENCOUNTER — Ambulatory Visit: Admitting: Orthopedic Surgery

## 2024-12-18 VITALS — BP 116/74 | Temp 98.6°F | Ht 62.0 in | Wt 163.0 lb

## 2024-12-18 DIAGNOSIS — T85192D Other mechanical complication of implanted electronic neurostimulator (electrode) of spinal cord, subsequent encounter: Secondary | ICD-10-CM

## 2024-12-18 DIAGNOSIS — Z09 Encounter for follow-up examination after completed treatment for conditions other than malignant neoplasm: Secondary | ICD-10-CM

## 2024-12-18 DIAGNOSIS — T85192S Other mechanical complication of implanted electronic neurostimulator (electrode) of spinal cord, sequela: Secondary | ICD-10-CM

## 2024-12-21 ENCOUNTER — Other Ambulatory Visit: Payer: Self-pay | Admitting: Orthopedic Surgery

## 2024-12-21 ENCOUNTER — Ambulatory Visit

## 2024-12-21 DIAGNOSIS — Z9689 Presence of other specified functional implants: Secondary | ICD-10-CM

## 2024-12-21 DIAGNOSIS — M546 Pain in thoracic spine: Secondary | ICD-10-CM

## 2024-12-21 MED ORDER — TIZANIDINE HCL 4 MG PO TABS: 4.0000 mg | ORAL_TABLET | Freq: Three times a day (TID) | ORAL | 0 refills | Status: AC | PRN

## 2024-12-21 NOTE — Telephone Encounter (Signed)
 Have her stop robaxin . I will send in new prescription for zanaflex.   I would also recommend she restart her oxycodone if pain is worse.

## 2024-12-21 NOTE — Telephone Encounter (Signed)
 Patient called the office stating she was in a lot of pain. States it was hard for her to lift/pull herself up. She explained this at her appointment on 12/18/24 but it has only been getting worse even with taking the muscle relaxer.    Patient will come in today to get an xray and would like a change of medication sent to  [id number] - ,  - [address]

## 2024-12-21 NOTE — Telephone Encounter (Signed)
 Patient advised

## 2025-01-15 ENCOUNTER — Encounter: Admitting: Neurosurgery
